# Patient Record
Sex: Male | Born: 1995
Health system: Southern US, Community
[De-identification: ages and names within clinical notes are randomized; demographics above are authoritative.]

## PROBLEM LIST (undated history)

## (undated) DIAGNOSIS — B019 Varicella without complication: Secondary | ICD-10-CM

## (undated) DIAGNOSIS — F909 Attention-deficit hyperactivity disorder, unspecified type: Secondary | ICD-10-CM

## (undated) DIAGNOSIS — B2790 Infectious mononucleosis, unspecified without complication: Secondary | ICD-10-CM

## (undated) DIAGNOSIS — J45909 Unspecified asthma, uncomplicated: Secondary | ICD-10-CM

## (undated) HISTORY — PX: TYMPANOSTOMY TUBE PLACEMENT: SHX32

## (undated) HISTORY — DX: Varicella without complication: B01.9

## (undated) HISTORY — PX: APPENDECTOMY: SHX54

## (undated) HISTORY — DX: Unspecified asthma, uncomplicated: J45.909

## (undated) HISTORY — DX: Attention-deficit hyperactivity disorder, unspecified type: F90.9

## (undated) HISTORY — DX: Infectious mononucleosis, unspecified without complication: B27.90

---
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
## 2016-11-20 ENCOUNTER — Ambulatory Visit (INDEPENDENT_AMBULATORY_CARE_PROVIDER_SITE_OTHER): Payer: 59 | Admitting: Family Medicine

## 2016-11-20 ENCOUNTER — Encounter: Payer: Self-pay | Admitting: Family Medicine

## 2016-11-20 VITALS — BP 132/88 | HR 84 | Temp 98.1°F | Ht 75.0 in | Wt 239.4 lb

## 2016-11-20 DIAGNOSIS — J45909 Unspecified asthma, uncomplicated: Secondary | ICD-10-CM | POA: Insufficient documentation

## 2016-11-20 DIAGNOSIS — W540XXA Bitten by dog, initial encounter: Secondary | ICD-10-CM | POA: Diagnosis not present

## 2016-11-20 DIAGNOSIS — Z23 Encounter for immunization: Secondary | ICD-10-CM

## 2016-11-20 DIAGNOSIS — J4530 Mild persistent asthma, uncomplicated: Secondary | ICD-10-CM | POA: Diagnosis not present

## 2016-11-20 DIAGNOSIS — F909 Attention-deficit hyperactivity disorder, unspecified type: Secondary | ICD-10-CM | POA: Diagnosis not present

## 2016-11-20 MED ORDER — ALBUTEROL SULFATE HFA 108 (90 BASE) MCG/ACT IN AERS: 2.0000 | INHALATION_SPRAY | Freq: Four times a day (QID) | RESPIRATORY_TRACT | 0 refills | Status: DC | PRN

## 2016-11-20 NOTE — Assessment & Plan Note (Signed)
On Adderall. We'll request records. Advised that I would not be able to prescribe medication until we see prior records.

## 2016-11-20 NOTE — Progress Notes (Signed)
Marikay Alar, MD Phone: [phone]  VENIAMIN KINCAID is a 21 y.o. male who presents today for new patient visit.  ADHD: Taking Adderall 25 mg daily. Followed by his prior PCP for this. No palpitations, decreased appetite, or weight changes.  Asthma: Uses his albuterol inhaler 2-3 times a week. Particularly has to use it after playing with his dog. Also occasionally has to use it after running for greater than 1.5 miles. No nighttime symptoms. No history of prednisone use.  About a week and a half ago the patient notes that he was playing with his dog and his dog bit him on his right hand. There was a small puncture wound. This has been healing well. There has been no redness, drainage, or fever. He's had no pain. The dog is up to date on vaccines. The dog has been behaving normally.   Active Ambulatory Problems    Diagnosis Date Noted  . ADHD 11/20/2016  . Asthma 11/20/2016  . Dog bite 11/20/2016   Resolved Ambulatory Problems    Diagnosis Date Noted  . No Resolved Ambulatory Problems   Past Medical History:  Diagnosis Date  . ADHD   . Asthma   . Chickenpox     Family History  Problem Relation Age of Onset  . Hyperlipidemia Father   . Heart disease Father   . Hypertension Father   . Hyperlipidemia Paternal Grandfather   . Heart disease Paternal Grandfather   . Hypertension Paternal Grandfather   . Arthritis Other     Social History   Social History  . Marital status: Single    Spouse name: N/A  . Number of children: N/A  . Years of education: N/A   Occupational History  . Not on file.   Social History Main Topics  . Smoking status: Never Smoker  . Smokeless tobacco: Never Used  . Alcohol use No  . Drug use: No  . Sexual activity: Not on file   Other Topics Concern  . Not on file   Social History Narrative  . No narrative on file    ROS  General:  Negative for nexplained weight loss, fever Skin: Negative for new or changing mole, sore that  won't heal HEENT: Negative for trouble hearing, trouble seeing, ringing in ears, mouth sores, hoarseness, change in voice, dysphagia. CV:  Negative for chest pain, dyspnea, edema, palpitations Resp: Negative for cough, dyspnea, hemoptysis GI: Negative for nausea, vomiting, diarrhea, constipation, abdominal pain, melena, hematochezia. GU: Negative for dysuria, incontinence, urinary hesitance, hematuria, vaginal or penile discharge, polyuria, sexual difficulty, lumps in testicle or breasts MSK: Negative for muscle cramps or aches, joint pain or swelling Neuro: Negative for headaches, weakness, numbness, dizziness, passing out/fainting Psych: Negative for depression, anxiety, memory problems  Objective  Physical Exam Vitals:   11/20/16 1329  BP: 132/88  Pulse: 84  Temp: 98.1 F (36.7 C)    BP Readings from Last 3 Encounters:  11/20/16 132/88   Wt Readings from Last 3 Encounters:  11/20/16 239 lb 6.4 oz (108.6 kg) (99 %, Z= 2.23)*   * Growth percentiles are based on CDC 2-20 Years data.    Physical Exam  Constitutional: No distress.  HENT:  Head: Normocephalic and atraumatic.  Mouth/Throat: Oropharynx is clear and moist. No oropharyngeal exudate.  Eyes: Conjunctivae are normal. Pupils are equal, round, and reactive to light.  Cardiovascular: Normal rate, regular rhythm and normal heart sounds.   Pulmonary/Chest: Effort normal and breath sounds normal.  Abdominal: Soft. Bowel sounds  are normal. He exhibits no distension. There is no tenderness. There is no rebound and no guarding.  Musculoskeletal: He exhibits no edema.  Neurological: He is alert. Gait normal.  Skin: Skin is warm and dry. He is not diaphoretic.  Small scab right dorsum of hand, no surrounding erythema, warmth, or tenderness  Psychiatric: Mood and affect normal.     Assessment/Plan:   ADHD On Adderall. We'll request records. Advised that I would not be able to prescribe medication until we see prior  records.  Asthma Recently controlled with albuterol use about 2-3 times a week. No nighttime symptoms. Discussed that if he has to start using his albuterol more frequently we could consider a controller medication. Discussed using his albuterol prior to exercising to help prevent issues while exercising. He'll monitor and if worsening let us know.  Dog bite Small scab noted. Injury was greater than a week ago. No signs of infection. Dog was completely vaccinated per patient report. Patient up-to-date on vaccinations as well. Discussed monitoring the area and if it appears infected to getting it looked at. Advised evaluation in the future with dog bites.   Orders Placed This Encounter  Procedures  . HPV 9-valent vaccine,Recombinat    Meds ordered this encounter  Medications  . amphetamine-dextroamphetamine (ADDERALL XR) 25 MG 24 hr capsule    Sig: Take 25 mg by mouth every morning.  Marland Kitchen. albuterol (PROVENTIL HFA;VENTOLIN HFA) 108 (90 Base) MCG/ACT inhaler    Sig: Inhale 2 puffs into the lungs every 6 (six) hours as needed for wheezing or shortness of breath.    Dispense:  1 Inhaler    Refill:  0    Marikay AlarEric Sonnenberg, MD [facility] [place]

## 2016-11-20 NOTE — Assessment & Plan Note (Signed)
Recently controlled with albuterol use about 2-3 times a week. No nighttime symptoms. Discussed that if he has to start using his albuterol more frequently we could consider a controller medication. Discussed using his albuterol prior to exercising to help prevent issues while exercising. He'll monitor and if worsening let us know.

## 2016-11-20 NOTE — Patient Instructions (Signed)
Nice to meet you. We'll request records from your prior PCP regarding your Adderall. Once we receive these we can take over prescribing. I have refilled your albuterol inhaler. He can try using it prior to running. If you start to require it more frequently please let us know. Please monitor the right hand. If you develop any swelling, warmth, erythema, or pain please let us know.

## 2016-11-20 NOTE — Assessment & Plan Note (Signed)
Small scab noted. Injury was greater than a week ago. No signs of infection. Dog was completely vaccinated per patient report. Patient up-to-date on vaccinations as well. Discussed monitoring the area and if it appears infected to getting it looked at. Advised evaluation in the future with dog bites.

## 2017-01-04 ENCOUNTER — Ambulatory Visit (INDEPENDENT_AMBULATORY_CARE_PROVIDER_SITE_OTHER): Payer: 59 | Admitting: Family Medicine

## 2017-01-04 ENCOUNTER — Encounter: Payer: Self-pay | Admitting: Family Medicine

## 2017-01-04 DIAGNOSIS — F909 Attention-deficit hyperactivity disorder, unspecified type: Secondary | ICD-10-CM

## 2017-01-04 MED ORDER — AMPHETAMINE-DEXTROAMPHET ER 25 MG PO CP24: 25.0000 mg | ORAL_CAPSULE | ORAL | 0 refills | Status: DC

## 2017-01-04 MED ORDER — AMPHETAMINE-DEXTROAMPHETAMINE 10 MG PO TABS: 10.0000 mg | ORAL_TABLET | Freq: Every day | ORAL | 0 refills | Status: DC | PRN

## 2017-01-04 MED ORDER — AMPHETAMINE-DEXTROAMPHET ER 25 MG PO CP24
25.0000 mg | ORAL_CAPSULE | ORAL | 0 refills | Status: DC
Start: 2017-01-04 — End: 2017-04-27

## 2017-01-04 NOTE — Patient Instructions (Signed)
Nice to see you. We'll provide you with Adderall refills. We'll see you back in 3 months.

## 2017-01-04 NOTE — Progress Notes (Signed)
  Lance Lance Cheron Coryell, MD Phone: [phone]  Lance Rodriguez is a 21 y.o. male who presents today for follow-up.  ADHD: Patient notes he has been on Adderall for the last several years. Notes it works well for him. He takes 25 mg of the extended release version in the morning daily. If he has to study he will take 10 mg of the immediate release version later in the day. He notes no sleep changes, appetite changes, weight changes, or palpitations. Prior records have been reviewed to confirm Adderall use.  ROS see history of present illness  Objective  Physical Exam Vitals:   01/04/17 0840  BP: 102/60  Pulse: 61  Temp: 97.9 F (36.6 C)    BP Readings from Last 3 Encounters:  01/04/17 102/60  11/20/16 132/88   Wt Readings from Last 3 Encounters:  01/04/17 241 lb 6.4 oz (109.5 kg) (99 %, Z= 2.26)*  11/20/16 239 lb 6.4 oz (108.6 kg) (99 %, Z= 2.23)*   * Growth percentiles are based on CDC 2-20 Years data.    Physical Exam  Constitutional: No distress.  Cardiovascular: Normal rate, regular rhythm and normal heart sounds.   Pulmonary/Chest: Effort normal and breath sounds normal.  Neurological: He is alert. Gait normal.  Skin: Skin is warm and dry. He is not diaphoretic.     Assessment/Plan: Please see individual problem list.  ADHD Doing well on Adderall. Records have been reviewed. Refills given. Follow-up in 3 months.   No orders of the defined types were placed in this encounter.   Meds ordered this encounter  Medications  . amphetamine-dextroamphetamine (ADDERALL XR) 25 MG 24 hr capsule    Sig: Take 1 capsule by mouth every morning.    Dispense:  30 capsule    Refill:  0  . amphetamine-dextroamphetamine (ADDERALL XR) 25 MG 24 hr capsule    Sig: Take 1 capsule by mouth every morning. Do not fill until 02/04/17    Dispense:  30 capsule    Refill:  0  . amphetamine-dextroamphetamine (ADDERALL XR) 25 MG 24 hr capsule    Sig: Take 1 capsule by mouth every morning.  Do not fill until 03/06/17    Dispense:  30 capsule    Refill:  0  . amphetamine-dextroamphetamine (ADDERALL) 10 MG tablet    Sig: Take 1 tablet (10 mg total) by mouth daily as needed (adhd).    Dispense:  30 tablet    Refill:  0  . amphetamine-dextroamphetamine (ADDERALL) 10 MG tablet    Sig: Take 1 tablet (10 mg total) by mouth daily as needed (adhd). Do not fill until 02/04/17    Dispense:  30 tablet    Refill:  0  . amphetamine-dextroamphetamine (ADDERALL) 10 MG tablet    Sig: Take 1 tablet (10 mg total) by mouth daily as needed (adhd). Do not fill until 03/06/17    Dispense:  30 tablet    Refill:  0    Lance Lance Lance Holladay, MD [facility] [place]

## 2017-01-04 NOTE — Assessment & Plan Note (Signed)
Doing well on Adderall. Records have been reviewed. Refills given. Follow-up in 3 months.

## 2017-04-10 ENCOUNTER — Telehealth: Payer: Self-pay | Admitting: Family Medicine

## 2017-04-10 NOTE — Telephone Encounter (Signed)
Please clarify with patient so I can advise

## 2017-04-10 NOTE — Telephone Encounter (Unsigned)
Copied from CRM [id number]. Topic: Inquiry >> Apr 10, 2017 11:24 AM Raquel SarnaHayes, Teresa G wrote: Aderal 25mg  - previously  Pt medication is not working  with and without food - working Mid day not working

## 2017-04-10 NOTE — Telephone Encounter (Signed)
Pt states Adderall

## 2017-04-27 ENCOUNTER — Other Ambulatory Visit: Payer: Self-pay

## 2017-04-27 ENCOUNTER — Encounter: Payer: Self-pay | Admitting: Family Medicine

## 2017-04-27 ENCOUNTER — Ambulatory Visit (INDEPENDENT_AMBULATORY_CARE_PROVIDER_SITE_OTHER): Payer: 59 | Admitting: Family Medicine

## 2017-04-27 DIAGNOSIS — Z23 Encounter for immunization: Secondary | ICD-10-CM

## 2017-04-27 DIAGNOSIS — F909 Attention-deficit hyperactivity disorder, unspecified type: Secondary | ICD-10-CM

## 2017-04-27 DIAGNOSIS — J4530 Mild persistent asthma, uncomplicated: Secondary | ICD-10-CM

## 2017-04-27 MED ORDER — AMPHETAMINE-DEXTROAMPHET ER 30 MG PO CP24: 30.0000 mg | ORAL_CAPSULE | ORAL | 0 refills | Status: DC

## 2017-04-27 MED ORDER — AMPHETAMINE-DEXTROAMPHETAMINE 12.5 MG PO TABS: 12.5000 mg | ORAL_TABLET | Freq: Every day | ORAL | 0 refills | Status: DC | PRN

## 2017-04-27 NOTE — Assessment & Plan Note (Signed)
Well-controlled. Continue to monitor. 

## 2017-04-27 NOTE — Progress Notes (Signed)
Lance AlarEric Symia Herdt, MD Phone: [phone]  Lance Rodriguez is a 21 y.o. male who presents today for follow-up.  ADHD: Patient notes more trouble focusing than prior.  He has been having trouble at school to some degree.  His English professor has noted this with him straying from topics during his papers.  He is interested in altering his regimen.  Previously noted this regimen worked well prior to college.  He is currently taking Adderall XR 25 mg in the morning and 10 mg later in the day to help study.  He notes no sleep changes.  No appetite suppression or weight loss.  No palpitations.  Asthma: Notes this is well controlled.  Rarely uses his albuterol after he runs.  No nighttime symptoms.  No wheezing.  No cough.  No shortness of breath.  Social History   Tobacco Use  Smoking Status Never Smoker  Smokeless Tobacco Never Used     ROS see history of present illness  Objective  Physical Exam Vitals:   04/27/17 1055  BP: 130/78  Pulse: 85  Temp: 98.2 F (36.8 C)  SpO2: 98%    BP Readings from Last 3 Encounters:  04/27/17 130/78  01/04/17 102/60 (<1 %, Z < -2.33 /  8 %, Z = -1.38)*  11/20/16 132/88 (72 %, Z = 0.59 /  94 %, Z = 1.54)*   *BP percentiles are based on the August 2017 AAP Clinical Practice Guideline for boys   Wt Readings from Last 3 Encounters:  04/27/17 235 lb 3.2 oz (106.7 kg)  01/04/17 241 lb 6.4 oz (109.5 kg) (99 %, Z= 2.26)*  11/20/16 239 lb 6.4 oz (108.6 kg) (99 %, Z= 2.23)*   * Growth percentiles are based on CDC (Boys, 2-20 Years) data.    Physical Exam  Constitutional: No distress.  Cardiovascular: Normal rate, regular rhythm and normal heart sounds.  Pulmonary/Chest: Effort normal and breath sounds normal.  Neurological: He is alert. Gait normal.  Skin: Skin is warm and dry. He is not diaphoretic.     Assessment/Plan: Please see individual problem list.  Asthma Well-controlled.  Continue to monitor.  ADHD Patient has had more  trouble since starting college.  Discussed options of trying an increased dose versus an alternative medication.  We opted to increase the dose given that he has responded well to his Adderall in the past.  Discussed monitoring for symptoms of side effects.  If not beneficial could consider an alternative medication.   Lance Rodriguez was seen today for follow-up.  Diagnoses and all orders for this visit:  Need for immunization against influenza -     Flu Vaccine QUAD 36+ mos IM  Mild persistent asthma without complication  Attention deficit hyperactivity disorder (ADHD), unspecified ADHD type  Other orders -     amphetamine-dextroamphetamine (ADDERALL XR) 30 MG 24 hr capsule; Take 1 capsule (30 mg total) by mouth every morning. -     amphetamine-dextroamphetamine (ADDERALL XR) 30 MG 24 hr capsule; Take 1 capsule (30 mg total) by mouth every morning. Do not fill until 06/27/17 -     amphetamine-dextroamphetamine (ADDERALL XR) 30 MG 24 hr capsule; Take 1 capsule (30 mg total) by mouth every morning. Do not fill until 05/27/17 -     amphetamine-dextroamphetamine (ADDERALL) 12.5 MG tablet; Take 1 tablet by mouth daily as needed (adhd). -     amphetamine-dextroamphetamine (ADDERALL) 12.5 MG tablet; Take 1 tablet by mouth daily as needed (adhd). Do not fill until 06/27/17 -  amphetamine-dextroamphetamine (ADDERALL) 12.5 MG tablet; Take 1 tablet by mouth daily as needed (adhd). Do not fill until 05/27/17    Orders Placed This Encounter  Procedures  . Flu Vaccine QUAD 36+ mos IM    Meds ordered this encounter  Medications  . amphetamine-dextroamphetamine (ADDERALL XR) 30 MG 24 hr capsule    Sig: Take 1 capsule (30 mg total) by mouth every morning.    Dispense:  30 capsule    Refill:  0  . amphetamine-dextroamphetamine (ADDERALL XR) 30 MG 24 hr capsule    Sig: Take 1 capsule (30 mg total) by mouth every morning. Do not fill until 06/27/17    Dispense:  30 capsule    Refill:  0  .  amphetamine-dextroamphetamine (ADDERALL XR) 30 MG 24 hr capsule    Sig: Take 1 capsule (30 mg total) by mouth every morning. Do not fill until 05/27/17    Dispense:  30 capsule    Refill:  0  . amphetamine-dextroamphetamine (ADDERALL) 12.5 MG tablet    Sig: Take 1 tablet by mouth daily as needed (adhd).    Dispense:  30 tablet    Refill:  0  . amphetamine-dextroamphetamine (ADDERALL) 12.5 MG tablet    Sig: Take 1 tablet by mouth daily as needed (adhd). Do not fill until 06/27/17    Dispense:  30 tablet    Refill:  0  . amphetamine-dextroamphetamine (ADDERALL) 12.5 MG tablet    Sig: Take 1 tablet by mouth daily as needed (adhd). Do not fill until 05/27/17    Dispense:  30 tablet    Refill:  0     Lance AlarEric Xabi Wittler, MD [facility] [place]

## 2017-04-27 NOTE — Patient Instructions (Signed)
Nice to see you. Please try the higher dose of the Adderall to see if that will help with your focusing. If you have not noticed an improvement following finals please let us know and we may need to try a different medication.  If you notice sleep issues, appetite suppression, weight loss, or palpitations with this change please let us know.

## 2017-04-27 NOTE — Assessment & Plan Note (Signed)
Patient has had more trouble since starting college.  Discussed options of trying an increased dose versus an alternative medication.  We opted to increase the dose given that he has responded well to his Adderall in the past.  Discussed monitoring for symptoms of side effects.  If not beneficial could consider an alternative medication.

## 2017-07-02 DIAGNOSIS — J029 Acute pharyngitis, unspecified: Secondary | ICD-10-CM | POA: Diagnosis not present

## 2017-07-02 DIAGNOSIS — J00 Acute nasopharyngitis [common cold]: Secondary | ICD-10-CM | POA: Diagnosis not present

## 2017-07-30 ENCOUNTER — Ambulatory Visit: Payer: 59 | Admitting: Family Medicine

## 2017-08-26 DIAGNOSIS — S61253A Open bite of left middle finger without damage to nail, initial encounter: Secondary | ICD-10-CM | POA: Diagnosis not present

## 2017-08-26 DIAGNOSIS — S61239A Puncture wound without foreign body of unspecified finger without damage to nail, initial encounter: Secondary | ICD-10-CM | POA: Diagnosis not present

## 2017-08-26 DIAGNOSIS — W5321XA Bitten by squirrel, initial encounter: Secondary | ICD-10-CM | POA: Diagnosis not present

## 2017-08-26 DIAGNOSIS — S61259A Open bite of unspecified finger without damage to nail, initial encounter: Secondary | ICD-10-CM | POA: Diagnosis not present

## 2018-12-05 ENCOUNTER — Ambulatory Visit (INDEPENDENT_AMBULATORY_CARE_PROVIDER_SITE_OTHER): Payer: 59 | Admitting: Internal Medicine

## 2018-12-05 ENCOUNTER — Other Ambulatory Visit: Payer: Self-pay

## 2018-12-05 ENCOUNTER — Telehealth: Payer: Self-pay | Admitting: *Deleted

## 2018-12-05 DIAGNOSIS — Z20822 Contact with and (suspected) exposure to covid-19: Secondary | ICD-10-CM

## 2018-12-05 DIAGNOSIS — J029 Acute pharyngitis, unspecified: Secondary | ICD-10-CM

## 2018-12-05 DIAGNOSIS — Z20828 Contact with and (suspected) exposure to other viral communicable diseases: Secondary | ICD-10-CM | POA: Diagnosis not present

## 2018-12-05 MED ORDER — AMOXICILLIN-POT CLAVULANATE 875-125 MG PO TABS: 1.0000 | ORAL_TABLET | Freq: Two times a day (BID) | ORAL | 0 refills | Status: DC

## 2018-12-05 NOTE — Progress Notes (Signed)
Virtual Visit via Video Note  I connected with Lance Rodriguez   on 12/05/18 at  8:00 AM EDT by a video enabled telemedicine application and verified that I am speaking with the correct person using two identifiers.  Location patient: home Location provider:work  Persons participating in the virtual visit: patient, provider  I discussed the limitations of evaluation and management by telemedicine and the availability of in person appointments. The patient expressed understanding and agreed to proceed.   HPI: Sore throat since 12/01/2018 returning from [facility] with family x 3 days. No sick contacts I.e family members w/o sx's no fever, no body aches, fatigue tried Advil and Nyquil w/o help also tried warm salt gargles and helps x 2-3 hours then throat is sore again. Throat is red and swollen and has swollen glands in his neck and trouble swallowing. No white changes in throat. No loss of taste/smell/sob   ROS: See pertinent positives and negatives per HPI.  Past Medical History:  Diagnosis Date  . ADHD   . Asthma   . Chickenpox     Past Surgical History:  Procedure Laterality Date  . APPENDECTOMY      Family History  Problem Relation Age of Onset  . Hyperlipidemia Father   . Heart disease Father   . Hypertension Father   . Hyperlipidemia Paternal Grandfather   . Heart disease Paternal Grandfather   . Hypertension Paternal Grandfather   . Arthritis Other     SOCIAL HX: lives with parents    Current Outpatient Medications:  .  albuterol (PROVENTIL HFA;VENTOLIN HFA) 108 (90 Base) MCG/ACT inhaler, Inhale 2 puffs into the lungs every 6 (six) hours as needed for wheezing or shortness of breath. (Patient not taking: Reported on 12/05/2018), Disp: 1 Inhaler, Rfl: 0 .  amoxicillin-clavulanate (AUGMENTIN) 875-125 MG tablet, Take 1 tablet by mouth 2 (two) times daily., Disp: 14 tablet, Rfl: 0 .  amphetamine-dextroamphetamine (ADDERALL XR) 30 MG 24 hr capsule, Take 1 capsule (30 mg  total) by mouth every morning. (Patient not taking: Reported on 12/05/2018), Disp: 30 capsule, Rfl: 0 .  amphetamine-dextroamphetamine (ADDERALL XR) 30 MG 24 hr capsule, Take 1 capsule (30 mg total) by mouth every morning. Do not fill until 06/27/17 (Patient not taking: Reported on 12/05/2018), Disp: 30 capsule, Rfl: 0 .  amphetamine-dextroamphetamine (ADDERALL XR) 30 MG 24 hr capsule, Take 1 capsule (30 mg total) by mouth every morning. Do not fill until 05/27/17 (Patient not taking: Reported on 12/05/2018), Disp: 30 capsule, Rfl: 0 .  amphetamine-dextroamphetamine (ADDERALL) 12.5 MG tablet, Take 1 tablet by mouth daily as needed (adhd). (Patient not taking: Reported on 12/05/2018), Disp: 30 tablet, Rfl: 0 .  amphetamine-dextroamphetamine (ADDERALL) 12.5 MG tablet, Take 1 tablet by mouth daily as needed (adhd). Do not fill until 06/27/17 (Patient not taking: Reported on 12/05/2018), Disp: 30 tablet, Rfl: 0 .  amphetamine-dextroamphetamine (ADDERALL) 12.5 MG tablet, Take 1 tablet by mouth daily as needed (adhd). Do not fill until 05/27/17 (Patient not taking: Reported on 12/05/2018), Disp: 30 tablet, Rfl: 0  EXAM:  VITALS per patient if applicable:  GENERAL: alert, oriented, appears well and in no acute distress  HEENT: atraumatic, conjunttiva clear, no obvious abnormalities on inspection of external nose and ears  NECK: normal movements of the head and neck  LUNGS: on inspection no signs of respiratory distress, breathing rate appears normal, no obvious gross SOB, gasping or wheezing  CV: no obvious cyanosis  MS: moves all visible extremities without noticeable abnormality  PSYCH/NEURO:  pleasant and cooperative, no obvious depression or anxiety, speech and thought processing grossly intact  ASSESSMENT AND PLAN:  Discussed the following assessment and plan:  Sore throat ? covid vs bacterial vs viral - Plan: amoxicillin-clavulanate (AUGMENTIN) 875-125 MG tablet bid x 1 week   Exposure to Covid-19  Virus - Plan: out of work until results back  Supportive care otc tylenol warm salt gargles  Note for work to be out      I discussed the assessment and treatment plan with the patient. The patient was provided an opportunity to ask questions and all were answered. The patient agreed with the plan and demonstrated an understanding of the instructions.   The patient was advised to call back or seek an in-person evaluation if the symptoms worsen or if the condition fails to improve as anticipated.  Time spent 15 minutes  Bevelyn Bucklesracy N McLean-Scocuzza, MD

## 2018-12-05 NOTE — Telephone Encounter (Signed)
Pt referred by Dr. Terese Door for covid-19 testing.  Pt scheduled for tomorrow at 8:45 at the [facility]. Advised that this is a drive thru test site and he would need to stay in the car with mask on and windows rolled up until time for testing.  He voiced understanding.

## 2018-12-06 ENCOUNTER — Other Ambulatory Visit: Payer: 59

## 2018-12-06 DIAGNOSIS — Z20822 Contact with and (suspected) exposure to covid-19: Secondary | ICD-10-CM

## 2018-12-07 ENCOUNTER — Encounter (INDEPENDENT_AMBULATORY_CARE_PROVIDER_SITE_OTHER): Payer: Self-pay

## 2018-12-08 ENCOUNTER — Encounter (INDEPENDENT_AMBULATORY_CARE_PROVIDER_SITE_OTHER): Payer: Self-pay

## 2018-12-09 ENCOUNTER — Encounter (INDEPENDENT_AMBULATORY_CARE_PROVIDER_SITE_OTHER): Payer: Self-pay

## 2018-12-11 LAB — NOVEL CORONAVIRUS, NAA: SARS-CoV-2, NAA: NOT DETECTED

## 2018-12-17 ENCOUNTER — Encounter (INDEPENDENT_AMBULATORY_CARE_PROVIDER_SITE_OTHER): Payer: Self-pay

## 2019-03-07 ENCOUNTER — Ambulatory Visit: Payer: 59 | Admitting: Family Medicine

## 2021-03-15 ENCOUNTER — Other Ambulatory Visit: Payer: Self-pay

## 2021-03-15 ENCOUNTER — Ambulatory Visit (INDEPENDENT_AMBULATORY_CARE_PROVIDER_SITE_OTHER): Payer: 59 | Admitting: Family Medicine

## 2021-03-15 VITALS — BP 118/80 | HR 74 | Temp 97.9°F | Ht 75.0 in | Wt 242.1 lb

## 2021-03-15 DIAGNOSIS — E669 Obesity, unspecified: Secondary | ICD-10-CM | POA: Diagnosis not present

## 2021-03-15 DIAGNOSIS — Z13220 Encounter for screening for lipoid disorders: Secondary | ICD-10-CM

## 2021-03-15 DIAGNOSIS — Z0001 Encounter for general adult medical examination with abnormal findings: Secondary | ICD-10-CM | POA: Insufficient documentation

## 2021-03-15 DIAGNOSIS — J309 Allergic rhinitis, unspecified: Secondary | ICD-10-CM | POA: Insufficient documentation

## 2021-03-15 DIAGNOSIS — J302 Other seasonal allergic rhinitis: Secondary | ICD-10-CM | POA: Insufficient documentation

## 2021-03-15 DIAGNOSIS — M25562 Pain in left knee: Secondary | ICD-10-CM | POA: Diagnosis not present

## 2021-03-15 LAB — LIPID PANEL
Cholesterol: 127 mg/dL (ref 0–200)
HDL: 46.3 mg/dL (ref >39.00)
LDL Cholesterol: 67 mg/dL (ref 0–99)
NonHDL: 80.57
Total CHOL/HDL Ratio: 3
Triglycerides: 67 mg/dL (ref 0.0–149.0)
VLDL: 13.4 mg/dL (ref 0.0–40.0)

## 2021-03-15 LAB — HEMOGLOBIN A1C: Hgb A1c MFr Bld: 4.7 % (ref 4.6–6.5)

## 2021-03-15 LAB — COMPREHENSIVE METABOLIC PANEL
ALT: 33 U/L (ref 0–53)
AST: 22 U/L (ref 0–37)
Albumin: 4.7 g/dL (ref 3.5–5.2)
Alkaline Phosphatase: 50 U/L (ref 39–117)
BUN: 15 mg/dL (ref 6–23)
CO2: 28 mEq/L (ref 19–32)
Calcium: 9.7 mg/dL (ref 8.4–10.5)
Chloride: 102 mEq/L (ref 96–112)
Creatinine, Ser: 1.18 mg/dL (ref 0.40–1.50)
GFR: 86.73 mL/min (ref >60.00)
Glucose, Bld: 89 mg/dL (ref 70–99)
Potassium: 4 mEq/L (ref 3.5–5.1)
Sodium: 138 mEq/L (ref 135–145)
Total Bilirubin: 0.9 mg/dL (ref 0.2–1.2)
Total Protein: 7.2 g/dL (ref 6.0–8.3)

## 2021-03-15 MED ORDER — FLUTICASONE PROPIONATE 50 MCG/ACT NA SUSP: 2.0000 | Freq: Every day | NASAL | 6 refills | Status: DC

## 2021-03-15 MED ORDER — NAPROXEN 500 MG PO TABS: 500.0000 mg | ORAL_TABLET | Freq: Two times a day (BID) | ORAL | 0 refills | Status: AC

## 2021-03-15 NOTE — Assessment & Plan Note (Signed)
The patient has chronic issues with allergies.  I suspect some of his congestion is rebound congestion from using Afrin on a constant basis.  We will have him discontinue the Afrin.  He will start on Flonase.  If this is not beneficial he will let me know.

## 2021-03-15 NOTE — Assessment & Plan Note (Signed)
Patient's high BMI is related to his muscular nature.  He is in very good shape and can continue with his diet and exercise activities.

## 2021-03-15 NOTE — Progress Notes (Signed)
Tommi Rumps, MD Phone: [phone]  Lance Rodriguez is a 25 y.o. male who presents today for CPE.  Diet: healthy, lots of lean meats and vegetables Exercise: lifts 6x/week, cardio 3x/week, he uses protein powder and creatine.  He does drink 3-4 64 ounce bottles of water per day. Family history-  Prostate cancer: no  Colon cancer: no Sexually active: not currently Vaccines-   Flu: declines  Tetanus: reports having around 2018  COVID19: declines HIV screening: donated blood previously Hep C Screening: donated blood previously Tobacco use: no Alcohol use: 4-5/GYBW Illicit Drug use: no Dentist: yes Ophthalmology: yes  Urgent rhinitis: This is a chronic issue.  He notes he is never really been able to breathe out of his nose well.  He has been using Afrin nasal spray for at least the last year.  He does note some sneezing and rhinorrhea as well as postnasal drip.  No fevers.  Left knee pain: This is in his anterior knee over his patellar tendon.  It started back in February when he started reintroduce squats.  He lifts very heavy weights.  His heavy squats are around 400 pounds.  His wider squats are around 315.  Notes it has improved over the last 2 weeks as he has decreased the weight of his squats.  Active Ambulatory Problems    Diagnosis Date Noted   ADHD 11/20/2016   Asthma 11/20/2016   Dog bite 11/20/2016   Encounter for general adult medical examination with abnormal findings 03/15/2021   Allergic rhinitis 03/15/2021   Left knee pain 03/15/2021   Obesity (BMI 30.0-34.9) 03/15/2021   Resolved Ambulatory Problems    Diagnosis Date Noted   No Resolved Ambulatory Problems   Past Medical History:  Diagnosis Date   Chickenpox     Family History  Problem Relation Age of Onset   Hyperlipidemia Father    Heart disease Father    Hypertension Father    Hyperlipidemia Paternal Grandfather    Heart disease Paternal Grandfather    Hypertension Paternal Grandfather     Arthritis Other     Social History   Socioeconomic History   Marital status: Single    Spouse name: Not on file   Number of children: Not on file   Years of education: Not on file   Highest education level: Not on file  Occupational History   Not on file  Tobacco Use   Smoking status: Never   Smokeless tobacco: Never  Substance and Sexual Activity   Alcohol use: No   Drug use: No   Sexual activity: Not on file  Other Topics Concern   Not on file  Social History Narrative   Not on file   Social Determinants of Health   Financial Resource Strain: Not on file  Food Insecurity: Not on file  Transportation Needs: Not on file  Physical Activity: Not on file  Stress: Not on file  Social Connections: Not on file  Intimate Partner Violence: Not on file    ROS  General:  Negative for nexplained weight loss, fever Skin: Negative for new or changing mole, sore that won't heal HEENT: Negative for trouble hearing, trouble seeing, ringing in ears, mouth sores, hoarseness, change in voice, dysphagia. CV:  Negative for chest pain, dyspnea, edema, palpitations Resp: Negative for cough, dyspnea, hemoptysis GI: Negative for nausea, vomiting, diarrhea, constipation, abdominal pain, melena, hematochezia. GU: Negative for dysuria, incontinence, urinary hesitance, hematuria, vaginal or penile discharge, polyuria, sexual difficulty, lumps in testicle or breasts  MSK: Negative for muscle cramps or aches, joint pain or swelling Neuro: Negative for headaches, weakness, numbness, dizziness, passing out/fainting Psych: Negative for depression, anxiety, memory problems  Objective  Physical Exam Vitals:   03/15/21 0816  BP: 118/80  Pulse: 74  Temp: 97.9 F (36.6 C)  SpO2: 99%    BP Readings from Last 3 Encounters:  03/15/21 118/80  04/27/17 130/78  01/04/17 102/60   Wt Readings from Last 3 Encounters:  03/15/21 242 lb 1.9 oz (109.8 kg)  04/27/17 235 lb 3.2 oz (106.7 kg)   01/04/17 241 lb 6.4 oz (109.5 kg) (99 %, Z= 2.26)*   * Growth percentiles are based on CDC (Boys, 2-20 Years) data.    Physical Exam Constitutional:      General: He is not in acute distress.    Appearance: He is not diaphoretic.  HENT:     Head: Normocephalic and atraumatic.  Eyes:     Conjunctiva/sclera: Conjunctivae normal.     Pupils: Pupils are equal, round, and reactive to light.  Cardiovascular:     Rate and Rhythm: Normal rate and regular rhythm.     Heart sounds: Normal heart sounds.  Pulmonary:     Effort: Pulmonary effort is normal.     Breath sounds: Normal breath sounds.  Abdominal:     General: Bowel sounds are normal. There is no distension.     Palpations: Abdomen is soft.     Tenderness: There is no abdominal tenderness. There is no guarding or rebound.  Musculoskeletal:     Right lower leg: No edema.     Left lower leg: No edema.     Comments: Left knee with slight tenderness over the patellar tendon, no bony tenderness, no other soft tissue tenderness, negative McMurray's  Lymphadenopathy:     Cervical: No cervical adenopathy.  Skin:    General: Skin is warm and dry.  Neurological:     Mental Status: He is alert.  Psychiatric:        Mood and Affect: Mood normal.     Assessment/Plan:   Problem List Items Addressed This Visit     Allergic rhinitis    The patient has chronic issues with allergies.  I suspect some of his congestion is rebound congestion from using Afrin on a constant basis.  We will have him discontinue the Afrin.  He will start on Flonase.  If this is not beneficial he will let me know.      Relevant Medications   fluticasone (FLONASE) 50 MCG/ACT nasal spray   Encounter for general adult medical examination with abnormal findings - Primary    Physical exam completed.  I encouraged continued healthy diet and exercise.  He declines flu and COVID vaccines.  I discussed that these were available if he changed his mind.  He reports  having had a tetanus vaccine in the last 5 to 6 years.  We were unable to pull up vaccine records today.  I will have the CMA try to pull these up at a later date.  Lab work as outlined.      Left knee pain    Suspect patellar tendon strain.  We will treat with a short course of Naprosyn.  He will take this with food.  Discussed that this could irritate his stomach and if it does he needs to let us know.  Discussed icing.  Advised to take a break from doing squats as those are the only things that cause the symptoms.  Advised he needs to take at least 1 to 2 weeks off and if he is still having symptoms after that timeframe he needs to let me know so we can refer to sports medicine.      Relevant Medications   naproxen (NAPROSYN) 500 MG tablet   Obesity (BMI 30.0-34.9)    Patient's high BMI is related to his muscular nature.  He is in very good shape and can continue with his diet and exercise activities.      Relevant Orders   HgB A1c   Other Visit Diagnoses     Lipid screening       Relevant Orders   Comp Met (CMET)   Lipid panel       Return in about 1 year (around 03/15/2022) for CPE.  This visit occurred during the SARS-CoV-2 public health emergency.  Safety protocols were in place, including screening questions prior to the visit, additional usage of staff PPE, and extensive cleaning of exam room while observing appropriate contact time as indicated for disinfecting solutions.    Tommi Rumps, MD [place]

## 2021-03-15 NOTE — Assessment & Plan Note (Signed)
Suspect patellar tendon strain.  We will treat with a short course of Naprosyn.  He will take this with food.  Discussed that this could irritate his stomach and if it does he needs to let us know.  Discussed icing.  Advised to take a break from doing squats as those are the only things that cause the symptoms.  Advised he needs to take at least 1 to 2 weeks off and if he is still having symptoms after that timeframe he needs to let me know so we can refer to sports medicine.

## 2021-03-15 NOTE — Assessment & Plan Note (Signed)
Physical exam completed.  I encouraged continued healthy diet and exercise.  He declines flu and COVID vaccines.  I discussed that these were available if he changed his mind.  He reports having had a tetanus vaccine in the last 5 to 6 years.  We were unable to pull up vaccine records today.  I will have the CMA try to pull these up at a later date.  Lab work as outlined.

## 2021-03-15 NOTE — Patient Instructions (Signed)
Nice to see you. Please continue with healthy diet and exercise. We will try to track down your vaccine records when the system is back up. Please ice your knee twice daily for about 10 minutes.  You need to take a break for at least 1 to 2 weeks from squats and if your symptoms are still persisting after that you need to let me know.  You can take the Naprosyn as prescribed for 5 days.  You should take this with food.  If it irritates her stomach please let us know. Please stop the Afrin.  You will start on the Flonase to see if that helps with your allergy symptoms.

## 2021-07-11 ENCOUNTER — Encounter: Payer: Self-pay | Admitting: Family Medicine

## 2021-07-11 DIAGNOSIS — J4530 Mild persistent asthma, uncomplicated: Secondary | ICD-10-CM

## 2021-07-11 DIAGNOSIS — J309 Allergic rhinitis, unspecified: Secondary | ICD-10-CM

## 2021-07-26 NOTE — Progress Notes (Signed)
NEW PATIENT Date of Service/Encounter:  07/27/21 Referring provider: Leone Haven, MD Primary care provider: Leone Haven, MD  Subjective:  Lance Rodriguez is a 26 y.o. male with a PMHx of ADHD, obesity, asthma status post tympanostomy tube placement presenting today for evaluation of chronic rhinitis and asthma. History obtained from: chart review and patient.   Chronic rhinitis: started when he was a young boy Symptoms include:  hyposmia, nasal congestion, rhinorrhea, post nasal drainage, sneezing, watery eyes, itchy eyes, and itchy nose  Occurs year-round Potential triggers: cats, outdoor pollen Treatments tried: Afrin nasal spray x 1 year, off since October 2022,  he then started fluticasone which made his nose bleed Previous allergy testing: yes-many years ago History of reflux/heartburn:  possibly felt some heartburn intermittent during a big diet change, he did clean up his diet and this resolved his symptoms  Asthma is intermittent, treats with albuterol PRN.   Using only if around dog in a small room, not even once per week.  Many years ago, he was tested and was positive to rice and peanuts.   He thinks these have resolved because he is eating these foods on a regular basis.  However, he would like retesting for reassurance.  Other allergy screening: Medication allergy: no Hymenoptera allergy: no Urticaria: no Eczema:no  Past Medical History: Past Medical History:  Diagnosis Date   ADHD    Asthma    Chickenpox    Medication List:  Current Outpatient Medications  Medication Sig Dispense Refill   albuterol (PROVENTIL HFA;VENTOLIN HFA) 108 (90 Base) MCG/ACT inhaler Inhale 2 puffs into the lungs every 6 (six) hours as needed for wheezing or shortness of breath. 1 Inhaler 0   phenylephrine (SUDAFED PE) 10 MG TABS tablet Take 10 mg by mouth 3 (three) times daily as needed.     fluticasone (FLONASE) 50 MCG/ACT nasal spray Place 2 sprays into both  nostrils daily. (Patient not taking: Reported on 07/27/2021) 16 g 6   No current facility-administered medications for this visit.   Known Allergies:  No Known Allergies Past Surgical History: Past Surgical History:  Procedure Laterality Date   APPENDECTOMY     TYMPANOSTOMY TUBE PLACEMENT     Family History: Family History  Problem Relation Age of Onset   Allergic rhinitis Mother    Allergic rhinitis Father    Hyperlipidemia Father    Heart disease Father    Hypertension Father    Eczema Brother    Hyperlipidemia Paternal Grandfather    Heart disease Paternal Grandfather    Hypertension Paternal Grandfather    Arthritis Other    Social History: Bessie lives in a house built 28 years ago, no water damage, carpet floors, gas heating, central AC, inside dog, no cockroaches, not using dust mite protection, no smoke exposure, 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   ROS:  All other systems negative except as noted per HPI.  Objective:  Blood pressure 116/66, pulse (!) 102, temperature 99.1 F (37.3 C), temperature source Temporal, resp. rate 12, height 6' 1.82" (1.875 m), weight 252 lb 12.8 oz (114.7 kg), SpO2 99 %. Body mass index is 32.62 kg/m. Physical Exam:  General Appearance:  Alert, cooperative, no distress, appears stated age  Head:  Normocephalic, without obvious abnormality, atraumatic  Eyes:  Conjunctiva clear, EOM's intact  Nose: Nares normal, hypertrophic turbinates, normal mucosa, and no visible anterior polyps  Throat: Lips, tongue normal; teeth and gums normal, normal  posterior oropharynx  Neck: Supple, symmetrical  Lungs:   clear to auscultation bilaterally, Respirations unlabored, no coughing  Heart:  regular rate and rhythm and no murmur, Appears well perfused  Extremities: No edema  Skin: Skin color, texture, turgor normal, no rashes or lesions on visualized portions of skin  Neurologic: No gross deficits      Diagnostics: Spirometry:  Tracings reviewed. His effort: Good reproducible efforts. FVC: 5.56L  FEV1: 4.94L, 97% predicted  FEV1/FVC ratio: 106%  Interpretation: Spirometry consistent with normal pattern   Skin Testing: Environmental allergy panel and select foods.  Adequate controls. Results discussed with patient/family.  Airborne Adult Perc - 07/27/21 1405     Time Antigen Placed 1410    Allergen Manufacturer Waynette ButteryGreer    Location Back    Number of Test 59    1. Control-Buffer 50% Glycerol Negative    2. Control-Histamine 1 mg/ml 3+    3. Albumin saline Negative    4. Bahia Negative    5. [place] 3+    6. Johnson 3+    7. Kentucky Blue 4+    8. Meadow Fescue Negative    9. Perennial Rye 4+    10. Sweet Vernal 3+    11. Timothy 3+    12. Cocklebur Negative    13. Burweed Marshelder Negative    14. Ragweed, short 4+    15. Ragweed, Giant 3+    16. Plantain,  English 3+    17. Lamb's Quarters Negative    18. Sheep Sorrell 3+    19. Rough Pigweed 2+    20. Marsh Elder, Rough 2+    21. Mugwort, Common Negative    22. Ash mix 3+    23. Birch mix Negative    24. Beech American 3+    25. Box, Elder 4+    26. Cedar, red 3+    27. Cottonwood, Eastern 3+    28. Elm mix Negative    29. Hickory 4+    30. Maple mix 3+    31. Oak, Guinea[place]Biss[place] mix 4+    32. Pecan Pollen 4+    33. Pine mix Negative    34. Sycamore Eastern Negative    35. Walnut, Black Pollen 3+    36. Alternaria alternata 3+    37. Cladosporium Herbarum Negative    38. Aspergillus mix Negative    39. Penicillium mix Negative    40. Bipolaris sorokiniana (Helminthosporium) Negative    41. Drechslera spicifera (Curvularia) 3+    42. Mucor plumbeus Negative    43. Fusarium moniliforme Negative    44. Aureobasidium pullulans (pullulara) Negative    45. Rhizopus oryzae Negative    46. Botrytis cinera Negative    47. Epicoccum nigrum 3+    48. Phoma betae 3+    49. Candida Albicans Negative    50.  Trichophyton mentagrophytes Negative    51. Mite, D Farinae  5,000 AU/ml 4+    52. Mite, D Pteronyssinus  5,000 AU/ml 4+    53. Cat Hair 10,000 BAU/ml Negative    54.  Dog Epithelia Negative    55. Mixed Feathers 3+    56. Horse Epithelia Negative    57. Cockroach, German Negative    58. Mouse Negative    59. Tobacco Leaf Negative             Intradermal - 07/27/21 1543     Time Antigen Placed 1518    Allergen Manufacturer Waynette ButteryGreer    Location  Arm    Number of Test 3    Control Negative    Cat 4+    Dog 4+             Food Adult Perc - 07/27/21 1400     Time Antigen Placed 1411    Allergen Manufacturer Lavella Hammock    Location Back    Number of allergen test 11    1. Peanut Negative    2. Soybean Negative    3. Wheat Negative    4. Sesame Negative    5. Milk, cow Negative    6. Egg White, Chicken Negative    7. Casein Negative    8. Shellfish Mix 2+    9. Fish Mix Negative    10. Cashew Negative    34. Rice Negative             Allergy testing results were read and interpreted by myself, documented by clinical staff.  Assessment and Plan  Chronic Rhinitis -seasonal and perennial allergic rhinitis with history of rhinitis medicamentosa: Uncontrolled - allergy testing today was positive to grasses, weeds, ragweed, trees, molds, dust mites, cats, dog, mixed feathers - allergen avoidance as below - consider allergy shots as long term control of your symptoms by teaching your immune system to be more tolerant of your allergy triggers - Start Singulair (Montelukast) 10mg  nightly. - Start over the counter antihistamine daily or daily as needed.   -Your options include Zyrtec (Cetirizine) 10mg , Claritin (Loratadine) 10mg , Allegra (Fexofenadine) 180mg , or Xyzal (Levocetirinze) 5mg  - Stop Sudafed (this also has a decongestant) - provided a baby Dosepak of prednisone to be used if needed to help come off Sudafed - Consider nasal saline rinses as needed to help moisturize  your nasal passages and rinse out pollens - sending in an Epipen (you will need to bring this to your allergy injection appointments)   Allergic Conjunctivitis: uncontrolled - Start Allergy Eye drops: great options include Pataday (Olopatadine) or Zaditor (ketotifen) for eye symptoms daily as needed-both sold over the counter if not covered by insurance.   -Avoid eye drops that say red eye relief as they may contain medications that dry out your eyes.  Intermittent Asthma: Controlled - your lung testing today looked great! - Rescue Inhaler: Albuterol (Proair/Ventolin) 2 puffs . Use  every 4-6 hours as needed for chest tightness, wheezing, or coughing.  Can also use 15 minutes prior to exercise if you have symptoms with activity. - Asthma is not controlled if:  - Symptoms are occurring >2 times a week OR  - >2 times a month nighttime awakenings  - You are requiring systemic steroids (prednisone/steroid injections) more than once per year  - Your require hospitalization for your asthma.  - Please call the clinic to schedule a follow up if these symptoms arise  Follow-up in 4 weeks, sooner if needed.   This note in its entirety was forwarded to the Provider who requested this consultation.  Thank you for your kind referral. I appreciate the opportunity to take part in Fard's care. Please do not hesitate to contact me with questions.  Sincerely,  Sigurd Sos, MD [facility]

## 2021-07-27 ENCOUNTER — Other Ambulatory Visit: Payer: Self-pay

## 2021-07-27 ENCOUNTER — Ambulatory Visit (INDEPENDENT_AMBULATORY_CARE_PROVIDER_SITE_OTHER): Payer: BC Managed Care – PPO | Admitting: Internal Medicine

## 2021-07-27 ENCOUNTER — Encounter: Payer: Self-pay | Admitting: Internal Medicine

## 2021-07-27 VITALS — BP 116/66 | HR 102 | Temp 99.1°F | Resp 12 | Ht 73.82 in | Wt 252.8 lb

## 2021-07-27 DIAGNOSIS — J310 Chronic rhinitis: Secondary | ICD-10-CM

## 2021-07-27 DIAGNOSIS — T781XXA Other adverse food reactions, not elsewhere classified, initial encounter: Secondary | ICD-10-CM | POA: Diagnosis not present

## 2021-07-27 DIAGNOSIS — H1013 Acute atopic conjunctivitis, bilateral: Secondary | ICD-10-CM

## 2021-07-27 DIAGNOSIS — J4530 Mild persistent asthma, uncomplicated: Secondary | ICD-10-CM | POA: Diagnosis not present

## 2021-07-27 DIAGNOSIS — J302 Other seasonal allergic rhinitis: Secondary | ICD-10-CM | POA: Insufficient documentation

## 2021-07-27 NOTE — Patient Instructions (Addendum)
Chronic Rhinitis -seasonal and perennial allergic rhinitis: ?- allergy testing today was positive to grasses, weeds, ragweed, trees, molds, dust mites, cats, dog, mixed feathers ?- allergen avoidance as below ?- consider allergy shots as long term control of your symptoms by teaching your immune system to be more tolerant of your allergy triggers ?- Start Singulair (Montelukast) 10mg  nightly. ?- Start over the counter antihistamine daily or daily as needed.   ?-Your options include Zyrtec (Cetirizine) 10mg , Claritin (Loratadine) 10mg , Allegra (Fexofenadine) 180mg , or Xyzal (Levocetirinze) 5mg  ?- Stop Sudafed (this also has a decongestant) ?- Consider nasal saline rinses as needed to help moisturize your nasal passages and rinse out pollens ?- sending in an Epipen (you will need to bring this to your allergy injection appointments) ? ? ?Allergic Conjunctivitis:  ?- Start Allergy Eye drops: great options include Pataday (Olopatadine) or Zaditor (ketotifen) for eye symptoms daily as needed-both sold over the counter if not covered by insurance.   ?-Avoid eye drops that say red eye relief as they may contain medications that dry out your eyes. ? ?Intermittent Asthma: ?- your lung testing today looked great! ?- Rescue Inhaler: Albuterol (Proair/Ventolin) 2 puffs . Use  every 4-6 hours as needed for chest tightness, wheezing, or coughing.  Can also use 15 minutes prior to exercise if you have symptoms with activity. ?- Asthma is not controlled if: ? - Symptoms are occurring >2 times a week OR ? - >2 times a month nighttime awakenings ? - You are requiring systemic steroids (prednisone/steroid injections) more than once per year ? - Your require hospitalization for your asthma. ? - Please call the clinic to schedule a follow up if these symptoms arise ? ?Follow-up in 4 weeks, sooner if needed.  ? ?

## 2021-07-28 NOTE — Addendum Note (Signed)
Addended by: Eloy End D on: 07/28/2021 04:59 PM ? ? Modules accepted: Orders ? ?

## 2021-08-11 ENCOUNTER — Encounter: Payer: Self-pay | Admitting: Internal Medicine

## 2021-08-11 ENCOUNTER — Other Ambulatory Visit: Payer: Self-pay

## 2021-08-11 ENCOUNTER — Telehealth (INDEPENDENT_AMBULATORY_CARE_PROVIDER_SITE_OTHER): Payer: 59 | Admitting: Internal Medicine

## 2021-08-11 VITALS — Ht 75.0 in | Wt 245.0 lb

## 2021-08-11 DIAGNOSIS — J029 Acute pharyngitis, unspecified: Secondary | ICD-10-CM

## 2021-08-11 MED ORDER — AMOXICILLIN-POT CLAVULANATE 875-125 MG PO TABS: 1.0000 | ORAL_TABLET | Freq: Two times a day (BID) | ORAL | 0 refills | Status: DC

## 2021-08-11 NOTE — Progress Notes (Signed)
Virtual Visit via Video Note ? ?I connected with Lance Rodriguez ? on 08/11/21 at  1:00 PM EDT by a video enabled telemedicine application and verified that I am speaking with the correct person using two identifiers. ? Location patient: [place] ?Location provider:work or home office ?Persons participating in the virtual visit: patient, provider ? ?I discussed the limitations and requested verbal permission for telemedicine visit. The patient expressed understanding and agreed to proceed. ? ? ?HPI: ? ?Acute telemedicine visit for : ?Sick visit seen at Urgent care Monday tested negative for covid, flu and strep has tonsils and c/o sore throat white spots since Monday tried hydrogen peroxide gargles and size has gone down some complains of fatigue. He works as an Airline pilot in [employer]  ? ? ?-Pertinent past medical history: see below ?-Pertinent medication allergies:No Known Allergies ?-COVID-19 vaccine status:  ?Immunization History  ?Administered Date(s) Administered  ? HPV 9-valent 11/20/2016  ? Influenza,inj,Quad PF,6+ Mos 04/27/2017  ? ? ? ?ROS: See pertinent positives and negatives per HPI. ? ?Past Medical History:  ?Diagnosis Date  ? ADHD   ? Asthma   ? Chickenpox   ? ? ?Past Surgical History:  ?Procedure Laterality Date  ? APPENDECTOMY    ? TYMPANOSTOMY TUBE PLACEMENT    ? ? ? ?Current Outpatient Medications:  ?  albuterol (PROVENTIL HFA;VENTOLIN HFA) 108 (90 Base) MCG/ACT inhaler, Inhale 2 puffs into the lungs every 6 (six) hours as needed for wheezing or shortness of breath., Disp: 1 Inhaler, Rfl: 0 ?  amoxicillin-clavulanate (AUGMENTIN) 875-125 MG tablet, Take 1 tablet by mouth 2 (two) times daily. With food, Disp: 14 tablet, Rfl: 0 ?  fluticasone (FLONASE) 50 MCG/ACT nasal spray, Place 2 sprays into both nostrils daily. (Patient not taking: Reported on 07/27/2021), Disp: 16 g, Rfl: 6 ?  phenylephrine (SUDAFED PE) 10 MG TABS tablet, Take 10 mg by mouth 3 (three) times daily as needed. (Patient not taking: Reported on 08/11/2021),  Disp: , Rfl:  ? ?EXAM: ? ?VITALS per patient if applicable: ? ?GENERAL: alert, oriented, appears ill but and in no acute distress ? ?HEENT: atraumatic, conjunttiva clear, no obvious abnormalities on inspection of external nose and ears ? ?NECK: normal movements of the head and neck ? ?LUNGS: on inspection no signs of respiratory distress, breathing rate appears normal, no obvious gross SOB, gasping or wheezing ? ?CV: no obvious cyanosis ? ?MS: moves all visible extremities without noticeable abnormality ? ?PSYCH/NEURO: pleasant and cooperative, no obvious depression or anxiety, speech and thought processing grossly intact ? ?ASSESSMENT AND PLAN: ? ?Discussed the following assessment and plan: ? ?Sore throat - Plan: Culture, Group A Strep, Mononucleosis screen, amoxicillin-clavulanate (AUGMENTIN) 875-125 MG tablet bid x 7 days  ? ?These are over the counter medication options:  ?Mucinex dm green label for cough or robitussin DM  ?Multivitamin or below vitamins  ?Vitamin C 1000 mg daily.  ?Vitamin D3 4000 Iu (units) daily.  ?Zinc 100 mg daily.  ?Quercetin 250-500 mg 2 times per day   ?Elderberry  ?Oil of oregano  ?cepacol or chloroseptic spray ?Warm salt water gargles +hydrogen peroxide ?Sugar free cough drops  ?Warm tea with honey and lemon  ?Hydration  ?Try to eat though you dont feel like it   ?Tylenol or Advil  ?Nasal saline and Flonase 2 sprays nasal congestion  ?If sneezing/runny nose over the counter allergy pill claritin,allegra, zyrtec, xyzal ?Quarantine x 10-14 days 14 days preferred  ? ?Monitor pulse oximeter, buy from [facility] if oxygen is less than 90  please go to the hospital.  ?   ?   ?Are you feeling really sick? Shortness of breath, cough, chest pain?, dizziness? Confusion  ? If so let me know  ?If worsening, go to hospital or [facility] clinic Urgent care for further treatment.    ? ?-we discussed possible serious and likely etiologies, options for evaluation and workup, limitations of telemedicine visit  vs in person visit, treatment, treatment risks and precautions. Pt is agreeable to treatment via telemedicine at this moment.  ?Work/School slipped offered: provided in patient instructions  yes ? ? ?I discussed the assessment and treatment plan with the patient. The patient was provided an opportunity to ask questions and all were answered. The patient agreed with the plan and demonstrated an understanding of the instructions. ?  ? ?Time spent 20 minutes ?Pasty Spillers McLean-Scocuzza, MD   ?

## 2021-08-11 NOTE — Patient Instructions (Signed)
?  These are over the counter medication options:  ?Mucinex dm green label for cough or robitussin DM  ?Multivitamin or below vitamins  ?Vitamin C 1000 mg daily.  ?Vitamin D3 4000 Iu (units) daily.  ?Zinc 100 mg daily.  ?Quercetin 250-500 mg 2 times per day   ?Elderberry  ?Oil of oregano  ?cepacol or chloroseptic spray ?Warm salt water gargles +hydrogen peroxide ?Sugar free cough drops  ?Warm tea with honey and lemon  ?Hydration  ?Try to eat though you dont feel like it   ?Tylenol or Advil  ?Nasal saline and Flonase 2 sprays nasal congestion  ?If sneezing/runny nose over the counter allergy pill claritin,allegra, zyrtec, xyzal ?Quarantine x 10-14 days 14 days preferred  ? ?Monitor pulse oximeter, buy from [employer] if oxygen is less than 90 please go to the hospital.  ?   ?   ?Are you feeling really sick? Shortness of breath, cough, chest pain?, dizziness? Confusion  ? If so let me know  ?If worsening, go to hospital or [facility] clinic Urgent care for further treatment.    ?

## 2021-08-12 ENCOUNTER — Encounter: Payer: Self-pay | Admitting: Internal Medicine

## 2021-08-12 LAB — MONONUCLEOSIS SCREEN: Mono Screen: POSITIVE — AB

## 2021-08-14 LAB — CULTURE, GROUP A STREP

## 2021-08-23 NOTE — Progress Notes (Deleted)
? ?FOLLOW UP ?Date of Service/Encounter:  08/23/21 ? ? ?Subjective:  ?Lance Rodriguez (DOB: 12-04-1995) is a 26 y.o. male who [facility] in re-evaluation of the following: *** ?History obtained from: chart review and {Persons; PED relatives w/patient:19415::"patient"}. ? ?For Review, LV was on 07/27/21  with Dr.Jerimyah Vandunk for initial visit.  Using sudafed and affrin at last visit which we discontinued.  Started singulair, OTC AH, pataday.   ? ?Pertinent History/Diagnostics:  ?- Asthma: intermittent ? -  spirometry (07/27/21-initial visit): ratio 106%, 97% FEV1  ?- Allergic Rhinitis:  ? - SPT environmental panel (07/27/21): positive to grasses, weeds, ragweed, trees, molds, dust mites, cats, dog, mixed feathers ? ?   ? ?Allergies as of 08/24/2021   ?No Known Allergies ?  ? ?  ?Medication List  ?  ? ?  ? Accurate as of August 23, 2021 12:45 PM. If you have any questions, ask your nurse or doctor.  ?  ?  ? ?  ? ?albuterol 108 (90 Base) MCG/ACT inhaler ?Commonly known as: VENTOLIN HFA ?Inhale 2 puffs into the lungs every 6 (six) hours as needed for wheezing or shortness of breath. ?  ?amoxicillin-clavulanate 875-125 MG tablet ?Commonly known as: Augmentin ?Take 1 tablet by mouth 2 (two) times daily. With food ?  ?fluticasone 50 MCG/ACT nasal spray ?Commonly known as: FLONASE ?Place 2 sprays into both nostrils daily. ?  ?phenylephrine 10 MG Tabs tablet ?Commonly known as: SUDAFED PE ?Take 10 mg by mouth 3 (three) times daily as needed. ?  ? ?  ? ?Past Medical History:  ?Diagnosis Date  ? ADHD   ? Asthma   ? Chickenpox   ? Mononucleosis   ? 08/11/21  ? ?Past Surgical History:  ?Procedure Laterality Date  ? APPENDECTOMY    ? TYMPANOSTOMY TUBE PLACEMENT    ? ?Otherwise, there have been no changes to his past medical history, surgical history, family history, or social history. ? ?ROS: All others negative except as noted per HPI.  ? ?Objective:  ?There were no vitals taken for this  visit. ?There is no height or weight on file to calculate BMI. ?Physical Exam: ?General Appearance:  Alert, cooperative, no distress, appears stated age  ?Head:  Normocephalic, without obvious abnormality, atraumatic  ?Eyes:  Conjunctiva clear, EOM's intact  ?Nose: Nares normal, {Blank multiple:19196:a:"***","hypertrophic turbinates","normal mucosa","no visible anterior polyps","septum midline"}  ?Throat: Lips, tongue normal; teeth and gums normal, {Blank multiple:19196:a:"***","normal posterior oropharynx","tonsils 2+","tonsils 3+","no tonsillar exudate","+ cobblestoning"}  ?Neck: Supple, symmetrical  ?Lungs:   {Blank multiple:19196:a:"***","clear to auscultation bilaterally","end-expiratory wheezing","wheezing throughout"}, Respirations unlabored, {Blank multiple:19196:a:"***","no coughing","intermittent dry coughing"}  ?Heart:  {Blank multiple:19196:a:"***","regular rate and rhythm","no murmur"}, Appears well perfused  ?Extremities: No edema  ?Skin: Skin color, texture, turgor normal, no rashes or lesions on visualized portions of skin  ?Neurologic: No gross deficits  ? ?Reviewed: *** ? ?Spirometry:  ?Tracings reviewed. His effort: {Blank single:19197::"Good reproducible efforts.","It was hard to get consistent efforts and there is a question as to whether this reflects a maximal maneuver.","Poor effort, data can not be interpreted.","Variable effort-results affected.","decent for first attempt at spirometry."} ?FVC: ***L ?FEV1: ***L, ***% predicted ?FEV1/FVC ratio: ***% ?Interpretation: {Blank single:19197::"Spirometry consistent with mild obstructive disease","Spirometry consistent with moderate obstructive disease","Spirometry consistent with severe obstructive disease","Spirometry consistent with possible restrictive disease","Spirometry consistent with mixed obstructive and restrictive disease","Spirometry uninterpretable due to technique","Spirometry consistent with normal pattern","No overt abnormalities  noted given today's efforts"}.  ?Please see scanned spirometry results for details. ? ?Skin  Testing: {Blank single:19197::"Select foods","Environmental allergy panel","Environmental allergy panel and select foods","Food allergy panel","None","Deferred due to recent antihistamines use"}. ?Positive test to: ***. Negative test to: ***.  ?Results discussed with patient/family. ? ? ?{Blank single:19197::"Allergy testing results were read and interpreted by myself, documented by clinical staff."," "} ? ?Assessment/Plan  ? ?*** ? ?Tonny Bollman, MD  ?[facility] ? ? ? ? ? ? ?

## 2021-08-24 ENCOUNTER — Ambulatory Visit: Payer: BC Managed Care – PPO | Admitting: Internal Medicine

## 2021-08-24 DIAGNOSIS — J309 Allergic rhinitis, unspecified: Secondary | ICD-10-CM

## 2021-09-12 NOTE — Progress Notes (Signed)
? ?FOLLOW UP ?Date of Service/Encounter:  09/14/21 ? ?Subjective:  ?Lance Rodriguez (DOB: 07-May-1996) is a 26 y.o. male PMHx of ADHD, obesity, asthma status post tympanostomy tube placement who returns to the [facility] [place] on 09/14/2021 in re-evaluation of the following: asthma and allergic rhinitis ?History obtained from: chart review and patient. ? ?For Review, LV was on 07/27/21  with Dr.Donis Pinder seen for initial visit.  Started on singulair, daily AH, discussed stopping sudafed.  Possible interest in AIT. ? ?Pertinent History/Diagnostics:  ?- Asthma: intermittent, dog is trigger, owns a dog ? -  spirometry (0301/23): ratio 106%, 97% FEV1 ?- Allergic Rhinitis and conjunctivitis: on chronic decongestants at initial visit  ? - SPT environmental panel (07/27/21): positive to grasses, weeds, ragweed, trees, molds, dust mites, cats, dog, mixed feathers ? ?Today presents for follow-up. ?Taking Xyzal when he really needs it.  Everything he is taking as needed, but will have symptoms anytime he is outside (for example playing golf, hunting, etc). ?He would like to move forward with allergy injections. ?He would like to be referred to ENT for deviated septum.  He has issues breathing out of his left nostril more than right despite using allergy medications. ?Asthma reported as controlled without need for rescue inhaler.  ? ?Allergies as of 09/14/2021   ?No Known Allergies ?  ? ?  ?Medication List  ?  ? ?  ? Accurate as of September 14, 2021 12:58 PM. If you have any questions, ask your nurse or doctor.  ?  ?  ? ?  ? ?STOP taking these medications   ? ?amoxicillin-clavulanate 875-125 MG tablet ?Commonly known as: Augmentin ?Stopped by: Sigurd Sos, MD ?  ?phenylephrine 10 MG Tabs tablet ?Commonly known as: SUDAFED PE ?Stopped by: Sigurd Sos, MD ?  ? ?  ? ?TAKE these medications   ? ?albuterol 108 (90 Base) MCG/ACT inhaler ?Commonly known as: VENTOLIN HFA ?Inhale 2 puffs into the lungs every 6 (six) hours as needed  for wheezing or shortness of breath. ?  ?fluticasone 50 MCG/ACT nasal spray ?Commonly known as: FLONASE ?Place 2 sprays into both nostrils daily. ?  ? ?  ? ?Past Medical History:  ?Diagnosis Date  ? ADHD   ? Asthma   ? Chickenpox   ? Mononucleosis   ? 08/11/21  ? ?Past Surgical History:  ?Procedure Laterality Date  ? APPENDECTOMY    ? TYMPANOSTOMY TUBE PLACEMENT    ? ?Otherwise, there have been no changes to his past medical history, surgical history, family history, or social history. ? ?ROS: All others negative except as noted per HPI.  ? ?Objective:  ?BP 110/76   Pulse 80   Temp 98 ?F (36.7 ?C) (Temporal)   Resp 18   SpO2 100%  ?There is no height or weight on file to calculate BMI. ?Physical Exam: ?General Appearance:  Alert, cooperative, no distress, appears stated age  ?Head:  Normocephalic, without obvious abnormality, atraumatic  ?Eyes:  Conjunctiva clear, EOM's intact  ?Nose: Nares normal, hypertrophic turbinates, normal mucosa, and no visible anterior polyps, leftward deviated septum  ?Throat: Lips, tongue normal; teeth and gums normal, normal posterior oropharynx  ?Neck: Supple, symmetrical  ?Lungs:   clear to auscultation bilaterally, Respirations unlabored, no coughing  ?Heart:  regular rate and rhythm and no murmur, Appears well perfused  ?Extremities: No edema  ?Skin: Skin color, texture, turgor normal, no rashes or lesions on visualized portions of skin  ?Neurologic: No gross deficits  ? ?Assessment/Plan  ? ?  Seasonal and perennial allergic rhinitis: partially controlled ?- allergen avoidance towards grasses, weeds, ragweed, trees, molds, dust mites, cats, dog, mixed feathers ?- allergy shots as long term control of your symptoms by teaching your immune system to be more tolerant of your allergy triggers ?- Continue Singulair (Montelukast) 10mg  nightly. ?- Continue over the counter antihistamine daily or daily as needed.   ?-Your options include Zyrtec (Cetirizine) 10mg , Claritin (Loratadine) 10mg ,  Allegra (Fexofenadine) 180mg , or Xyzal (Levocetirinze) 5mg  ?- Consider nasal saline rinses as needed to help moisturize your nasal passages and rinse out pollens ?- bring Epipen to your allergy injection appointments ? ?Deviated septum ?- consider breath right strips at night to help with sleep ?- will send referral to ENT for evaluation ? ?Allergic Conjunctivitis: partially controlled ?- Continue Allergy Eye drops: great options include Pataday (Olopatadine) or Zaditor (ketotifen) for eye symptoms daily as needed-both sold over the counter if not covered by insurance.   ?-Avoid eye drops that say red eye relief as they may contain medications that dry out your eyes. ? ?Intermittent Asthma:controlled ?- Rescue Inhaler: Albuterol (Proair/Ventolin) 2 puffs . Use  every 4-6 hours as needed for chest tightness, wheezing, or coughing.  Can also use 15 minutes prior to exercise if you have symptoms with activity. ?- Asthma is not controlled if: ? - Symptoms are occurring >2 times a week OR ? - >2 times a month nighttime awakenings ? - You are requiring systemic steroids (prednisone/steroid injections) more than once per year ? - Your require hospitalization for your asthma. ? - Please call the clinic to schedule a follow up if these symptoms arise ? ?Follow-up in 3 months with provider, sooner if needed.  ?Schedule first allergy injection for the next 3 to 4 weeks. ? ?Sigurd Sos, MD  ?[facility] ? ? ? ? ? ? ?

## 2021-09-14 ENCOUNTER — Encounter: Payer: Self-pay | Admitting: Internal Medicine

## 2021-09-14 ENCOUNTER — Ambulatory Visit (INDEPENDENT_AMBULATORY_CARE_PROVIDER_SITE_OTHER): Payer: BC Managed Care – PPO | Admitting: Internal Medicine

## 2021-09-14 VITALS — BP 110/76 | HR 80 | Temp 98.0°F | Resp 18

## 2021-09-14 DIAGNOSIS — J3089 Other allergic rhinitis: Secondary | ICD-10-CM

## 2021-09-14 DIAGNOSIS — J342 Deviated nasal septum: Secondary | ICD-10-CM | POA: Diagnosis not present

## 2021-09-14 DIAGNOSIS — H1013 Acute atopic conjunctivitis, bilateral: Secondary | ICD-10-CM | POA: Diagnosis not present

## 2021-09-14 DIAGNOSIS — J4520 Mild intermittent asthma, uncomplicated: Secondary | ICD-10-CM | POA: Diagnosis not present

## 2021-09-14 DIAGNOSIS — J302 Other seasonal allergic rhinitis: Secondary | ICD-10-CM

## 2021-09-14 NOTE — Patient Instructions (Addendum)
Seasonal and perennial allergic rhinitis: ?- allergen avoidance towards grasses, weeds, ragweed, trees, molds, dust mites, cats, dog, mixed feathers ?- allergy shots as long term control of your symptoms by teaching your immune system to be more tolerant of your allergy triggers ?- Continue Singulair (Montelukast) 10mg  nightly. ?- Continue over the counter antihistamine daily or daily as needed.   ?-Your options include Zyrtec (Cetirizine) 10mg , Claritin (Loratadine) 10mg , Allegra (Fexofenadine) 180mg , or Xyzal (Levocetirinze) 5mg  ?- Consider nasal saline rinses as needed to help moisturize your nasal passages and rinse out pollens ?- bring Epipen to your allergy injection appointments ? ?Deviated septum ?- consider breath right strips at night to help with sleep ?- will send referral to ENT for evaluation ? ?Allergic Conjunctivitis:  ?- Continue Allergy Eye drops: great options include Pataday (Olopatadine) or Zaditor (ketotifen) for eye symptoms daily as needed-both sold over the counter if not covered by insurance.   ?-Avoid eye drops that say red eye relief as they may contain medications that dry out your eyes. ? ?Intermittent Asthma: ?- Rescue Inhaler: Albuterol (Proair/Ventolin) 2 puffs . Use  every 4-6 hours as needed for chest tightness, wheezing, or coughing.  Can also use 15 minutes prior to exercise if you have symptoms with activity. ?- Asthma is not controlled if: ? - Symptoms are occurring >2 times a week OR ? - >2 times a month nighttime awakenings ? - You are requiring systemic steroids (prednisone/steroid injections) more than once per year ? - Your require hospitalization for your asthma. ? - Please call the clinic to schedule a follow up if these symptoms arise ? ?Follow-up in 3 months with provider, sooner if needed.  ?Schedule first allergy injection for the next 3 to 4 weeks. ?

## 2021-09-14 NOTE — Progress Notes (Signed)
Aeroallergen Immunotherapy  ? ?Ordering Provider: Dr. Tonny Bollman  ? ?Patient Details  ?Name: Lance Rodriguez  ?MRN: [id number]  ?Date of Birth: September 07, 1995  ? ?Order 2 of 2  ? ?Vial Label: M-Cat-DM  ? ?0.2 ml (Volume)  1:20 Concentration -- Alternaria alternata  ?0.2 ml (Volume)  1:20 Concentration -- Drechslera spicifera  ?0.2 ml (Volume)  1:40 Concentration -- Epicoccum nigrum  ?0.2 ml (Volume)  1:40 Concentration -- Phoma betae  ?0.5 ml (Volume)  1:10 Concentration -- Cat Hair  ?0.5 ml (Volume)   AU Concentration -- Mite Mix (DF 5,000 & DP 5,000)  ? ? ?1.8  ml Extract Subtotal  ?3.2  ml Diluent  ?5.0  ml Maintenance Total  ? ?Schedule:  B  ?Blue Vial (1:100,000): Schedule B (6 doses)  ?Yellow Vial (1:10,000): Schedule B (6 doses)  ?Green Vial (1:1,000): Schedule B (6 doses)  ?Red Vial (1:100): Schedule A (10 doses)  ? ?Special Instructions: normal B ?

## 2021-09-14 NOTE — Progress Notes (Signed)
VIALS EXP 09-16-22 ?

## 2021-09-14 NOTE — Progress Notes (Signed)
Aeroallergen Immunotherapy  ? ?Ordering Provider: Dr. Sigurd Sos  ? ?Patient Details  ?Name: Lance Rodriguez  ?MRN: [id number]  ?Date of Birth: [birth year]  ? ?Order 1 of 2  ? ?Vial Label: G-W-T-dog  ? ?0.3 ml (Volume)  BAU Concentration -- 7 Grass Mix* 100,000 ([address] [place], [place], [place], [place], [place], Sweet Vernal, [place]ia [place])  ?0.3 ml (Volume)  BAU Concentration -- [place] 10,000  ?0.2 ml (Volume)  1:20 Concentration -- [place]  ?0.3 ml (Volume)  1:20 Concentration -- Ragweed Mix  ?0.2 ml (Volume)  1:10 Concentration -- Plantain English  ?0.5 ml (Volume)  1:20 Concentration -- Weed Mix*  ?0.5 ml (Volume)  1:20 Concentration -- Eastern 10 Tree Mix (also Sweet Gum)  ?0.2 ml (Volume)  1:20 Concentration -- Box Elder  ?0.2 ml (Volume)  1:10 Concentration -- Cedar, red  ?0.2 ml (Volume)  1:10 Concentration -- Pecan Pollen  ?0.2 ml (Volume)  1:20 Concentration -- Walnut, Black Pollen  ?0.5 ml (Volume)  1:10 Concentration -- Dog Epithelia  ? ? ?3.6  ml Extract Subtotal  ?1.4  ml Diluent  ?5.0  ml Maintenance Total  ? ?Schedule:  B  ?Blue Vial (1:100,000): Schedule B (6 doses)  ?Yellow Vial (1:10,000): Schedule B (6 doses)  ?Green Vial (1:1,000): Schedule B (6 doses)  ?Red Vial (1:100): Schedule A (10 doses)  ? ?Special Instructions: normal B schedule ?

## 2021-09-15 ENCOUNTER — Telehealth: Payer: Self-pay | Admitting: Internal Medicine

## 2021-09-15 DIAGNOSIS — J3081 Allergic rhinitis due to animal (cat) (dog) hair and dander: Secondary | ICD-10-CM | POA: Diagnosis not present

## 2021-09-15 NOTE — Telephone Encounter (Signed)
-----   Message from Tonny Bollman, MD sent at 09/14/2021  1:01 PM EDT ----- ?Please place referral for ENT-no preference. ?

## 2021-09-15 NOTE — Telephone Encounter (Signed)
Referral has been placed to [facility])  ? ?[address] ?[address] ?[place] [address] ?Ph: [phone] ?Fax: [phone] ? ?Called & advised patient of referral placement. Provided patient with address & phone number to [facility]. Advised patient to reach out to their office to get scheduled.  ?Patient requested I send him information over mychart. Sent [employer].  ?

## 2021-09-16 DIAGNOSIS — J3081 Allergic rhinitis due to animal (cat) (dog) hair and dander: Secondary | ICD-10-CM | POA: Diagnosis not present

## 2021-10-05 ENCOUNTER — Ambulatory Visit (INDEPENDENT_AMBULATORY_CARE_PROVIDER_SITE_OTHER): Payer: BC Managed Care – PPO

## 2021-10-05 DIAGNOSIS — J309 Allergic rhinitis, unspecified: Secondary | ICD-10-CM

## 2021-10-27 ENCOUNTER — Ambulatory Visit (INDEPENDENT_AMBULATORY_CARE_PROVIDER_SITE_OTHER): Payer: BC Managed Care – PPO

## 2021-10-27 DIAGNOSIS — J309 Allergic rhinitis, unspecified: Secondary | ICD-10-CM | POA: Diagnosis not present

## 2021-11-04 ENCOUNTER — Ambulatory Visit (INDEPENDENT_AMBULATORY_CARE_PROVIDER_SITE_OTHER): Payer: BC Managed Care – PPO

## 2021-11-04 DIAGNOSIS — J309 Allergic rhinitis, unspecified: Secondary | ICD-10-CM

## 2021-11-10 ENCOUNTER — Ambulatory Visit (INDEPENDENT_AMBULATORY_CARE_PROVIDER_SITE_OTHER): Payer: BC Managed Care – PPO

## 2021-11-10 DIAGNOSIS — J309 Allergic rhinitis, unspecified: Secondary | ICD-10-CM | POA: Diagnosis not present

## 2021-11-17 ENCOUNTER — Ambulatory Visit (INDEPENDENT_AMBULATORY_CARE_PROVIDER_SITE_OTHER): Payer: BC Managed Care – PPO

## 2021-11-17 DIAGNOSIS — J309 Allergic rhinitis, unspecified: Secondary | ICD-10-CM

## 2021-11-24 ENCOUNTER — Ambulatory Visit (INDEPENDENT_AMBULATORY_CARE_PROVIDER_SITE_OTHER): Payer: BC Managed Care – PPO

## 2021-11-24 DIAGNOSIS — J309 Allergic rhinitis, unspecified: Secondary | ICD-10-CM | POA: Diagnosis not present

## 2021-12-08 ENCOUNTER — Ambulatory Visit (INDEPENDENT_AMBULATORY_CARE_PROVIDER_SITE_OTHER): Payer: BC Managed Care – PPO

## 2021-12-08 DIAGNOSIS — J309 Allergic rhinitis, unspecified: Secondary | ICD-10-CM | POA: Diagnosis not present

## 2021-12-12 NOTE — Progress Notes (Unsigned)
FOLLOW UP Date of Service/Encounter:  12/14/21   Subjective:  Lance Rodriguez (DOB: 1995-10-28) is a 26 y.o. male who [facility] in re-evaluation of the following: asthma and allergic rhinitis History obtained from: chart review and patient.  For Review, LV was on 09/14/21  with Dr.Dannette Kinkaid seen for routine follow-up.  Interested in AIT.  Referred to ENT for deviated septum.  Also advised breath right strips.   Today presents for follow-up. Asthma: Doing great, no ED, UC or systemic steroids required since his last visit.  Allergic rhinitis/conjunctivitis: Doing great, has been outdoors playing golf and playing with his dog and feels his symptoms are much improved since starting allergy injections. Tolerating injections well without adverse events. He is pleased with current progress. Recently returned from [place] with this family.  He is going to see ENT in a few weeks about his deviated septum. He is looking forward to that consultation.  He is receiving AIT. Currently in blue vial  ----------------------------------------- Pertinent History/Diagnostics:  - Asthma: intermittent, dog is trigger, owns a dog                -  spirometry (0301/23): ratio 106%, 97% FEV1 - Allergic Rhinitis and conjunctivitis: on chronic decongestants at initial visit. AIT started on 10/05/21.                - SPT environmental panel (07/27/21): positive to grasses, weeds, ragweed, trees, molds, dust mites, cats, dog, mixed feathers  Allergies as of 12/14/2021   No Known Allergies      Medication List        Accurate as of December 14, 2021  3:57 PM. If you have any questions, ask your nurse or doctor.          albuterol 108 (90 Base) MCG/ACT inhaler Commonly known as: VENTOLIN HFA Inhale 2 puffs into the lungs every 6 (six) hours as needed for wheezing or shortness of breath.   fluticasone 50 MCG/ACT nasal spray Commonly known as:  FLONASE Place 2 sprays into both nostrils daily.       Past Medical History:  Diagnosis Date   ADHD    Asthma    Chickenpox    Mononucleosis    08/11/21   Past Surgical History:  Procedure Laterality Date   APPENDECTOMY     TYMPANOSTOMY TUBE PLACEMENT     Otherwise, there have been no changes to his past medical history, surgical history, family history, or social history.  ROS: All others negative except as noted per HPI.   Objective:  BP 122/60   Pulse 85   Temp 98.3 F (36.8 C) (Temporal)   Resp 16   Wt 256 lb 9.6 oz (116.4 kg)   SpO2 98%   BMI 32.07 kg/m  Body mass index is 32.07 kg/m. Physical Exam: General Appearance:  Alert, cooperative, no distress, appears stated age  Head:  Normocephalic, without obvious abnormality, atraumatic  Eyes:  Conjunctiva clear, EOM's intact  Nose: Nares normal, hypertrophic turbinates, normal mucosa, and no visible anterior polyps  Throat: Lips, tongue normal; teeth and gums normal, normal posterior oropharynx  Neck: Supple, symmetrical  Lungs:   clear to auscultation bilaterally, Respirations unlabored, no coughing  Heart:  regular rate and rhythm and no murmur, Appears well perfused  Extremities: No edema  Skin: Skin color, texture, turgor normal, no rashes or lesions on visualized portions of skin  Neurologic: No gross deficits   Assessment/Plan  Seasonal and perennial allergic rhinitis: controlled - allergen avoidance towards grasses, weeds, ragweed, trees, molds, dust mites, cats, dog, mixed feathers - continue allergy injections per protocol - Continue Singulair (Montelukast) 10mg  nightly. - Continue over the counter antihistamine daily or daily as needed.   -Your options include Zyrtec (Cetirizine) 10mg , Claritin (Loratadine) 10mg , Allegra (Fexofenadine) 180mg , or Xyzal (Levocetirinze) 5mg  - Consider nasal saline rinses as needed to help moisturize your nasal passages and rinse out pollens - bring Epipen to your  allergy injection appointments  Deviated septum - consider breath right strips at night to help with sleep - follow-up with ENT as planned  Allergic Conjunctivitis:  - Continue Allergy Eye drops: great options include Pataday (Olopatadine) or Zaditor (ketotifen) for eye symptoms daily as needed-both sold over the counter if not covered by insurance.   -Avoid eye drops that say red eye relief as they may contain medications that dry out your eyes.  Intermittent Asthma: - Rescue Inhaler: Albuterol (Proair/Ventolin) 2 puffs . Use  every 4-6 hours as needed for chest tightness, wheezing, or coughing.  Can also use 15 minutes prior to exercise if you have symptoms with activity. - Asthma is not controlled if:  - Symptoms are occurring >2 times a week OR  - >2 times a month nighttime awakenings  - You are requiring systemic steroids (prednisone/steroid injections) more than once per year  - Your require hospitalization for your asthma.  - Please call the clinic to schedule a follow up if these symptoms arise  Follow-up in 6 months, sooner if needed.  It was a pleasure to see you today!   , MD  [facility]

## 2021-12-14 ENCOUNTER — Ambulatory Visit (INDEPENDENT_AMBULATORY_CARE_PROVIDER_SITE_OTHER): Payer: BC Managed Care – PPO | Admitting: Internal Medicine

## 2021-12-14 ENCOUNTER — Encounter: Payer: Self-pay | Admitting: Internal Medicine

## 2021-12-14 ENCOUNTER — Ambulatory Visit (INDEPENDENT_AMBULATORY_CARE_PROVIDER_SITE_OTHER): Payer: BC Managed Care – PPO | Admitting: *Deleted

## 2021-12-14 VITALS — BP 122/60 | HR 85 | Temp 98.3°F | Resp 16 | Wt 256.6 lb

## 2021-12-14 DIAGNOSIS — J309 Allergic rhinitis, unspecified: Secondary | ICD-10-CM | POA: Diagnosis not present

## 2021-12-14 DIAGNOSIS — J342 Deviated nasal septum: Secondary | ICD-10-CM | POA: Diagnosis not present

## 2021-12-14 DIAGNOSIS — J4520 Mild intermittent asthma, uncomplicated: Secondary | ICD-10-CM

## 2021-12-14 DIAGNOSIS — H1013 Acute atopic conjunctivitis, bilateral: Secondary | ICD-10-CM | POA: Diagnosis not present

## 2021-12-14 DIAGNOSIS — J3089 Other allergic rhinitis: Secondary | ICD-10-CM

## 2021-12-14 DIAGNOSIS — J302 Other seasonal allergic rhinitis: Secondary | ICD-10-CM

## 2021-12-14 NOTE — Patient Instructions (Addendum)
Seasonal and perennial allergic rhinitis: controlled - allergen avoidance towards grasses, weeds, ragweed, trees, molds, dust mites, cats, dog, mixed feathers - continue allergy injections per protocol - Continue Singulair (Montelukast) 10mg  nightly. - Continue over the counter antihistamine daily or daily as needed.   -Your options include Zyrtec (Cetirizine) 10mg , Claritin (Loratadine) 10mg , Allegra (Fexofenadine) 180mg , or Xyzal (Levocetirinze) 5mg  - Consider nasal saline rinses as needed to help moisturize your nasal passages and rinse out pollens - bring Epipen to your allergy injection appointments  Deviated septum - consider breath right strips at night to help with sleep - follow-up with ENT as planned  Allergic Conjunctivitis:  - Continue Allergy Eye drops: great options include Pataday (Olopatadine) or Zaditor (ketotifen) for eye symptoms daily as needed-both sold over the counter if not covered by insurance.   -Avoid eye drops that say red eye relief as they may contain medications that dry out your eyes.  Intermittent Asthma: - Rescue Inhaler: Albuterol (Proair/Ventolin) 2 puffs . Use  every 4-6 hours as needed for chest tightness, wheezing, or coughing.  Can also use 15 minutes prior to exercise if you have symptoms with activity. - Asthma is not controlled if:  - Symptoms are occurring >2 times a week OR  - >2 times a month nighttime awakenings  - You are requiring systemic steroids (prednisone/steroid injections) more than once per year  - Your require hospitalization for your asthma.  - Please call the clinic to schedule a follow up if these symptoms arise  Follow-up in 6 months, sooner if needed.  It was a pleasure to see you today!

## 2021-12-21 ENCOUNTER — Ambulatory Visit (INDEPENDENT_AMBULATORY_CARE_PROVIDER_SITE_OTHER): Payer: BC Managed Care – PPO

## 2021-12-21 DIAGNOSIS — J309 Allergic rhinitis, unspecified: Secondary | ICD-10-CM | POA: Diagnosis not present

## 2021-12-29 ENCOUNTER — Ambulatory Visit (INDEPENDENT_AMBULATORY_CARE_PROVIDER_SITE_OTHER): Payer: BC Managed Care – PPO

## 2021-12-29 DIAGNOSIS — J309 Allergic rhinitis, unspecified: Secondary | ICD-10-CM | POA: Diagnosis not present

## 2022-01-05 ENCOUNTER — Ambulatory Visit (INDEPENDENT_AMBULATORY_CARE_PROVIDER_SITE_OTHER): Payer: BC Managed Care – PPO

## 2022-01-05 DIAGNOSIS — J309 Allergic rhinitis, unspecified: Secondary | ICD-10-CM

## 2022-01-12 ENCOUNTER — Ambulatory Visit (INDEPENDENT_AMBULATORY_CARE_PROVIDER_SITE_OTHER): Payer: BC Managed Care – PPO

## 2022-01-12 DIAGNOSIS — J309 Allergic rhinitis, unspecified: Secondary | ICD-10-CM | POA: Diagnosis not present

## 2022-01-18 DIAGNOSIS — J34829 Nasal valve collapse, unspecified: Secondary | ICD-10-CM | POA: Insufficient documentation

## 2022-01-18 DIAGNOSIS — J302 Other seasonal allergic rhinitis: Secondary | ICD-10-CM | POA: Insufficient documentation

## 2022-01-18 DIAGNOSIS — J342 Deviated nasal septum: Secondary | ICD-10-CM | POA: Diagnosis not present

## 2022-01-18 DIAGNOSIS — J343 Hypertrophy of nasal turbinates: Secondary | ICD-10-CM | POA: Insufficient documentation

## 2022-01-18 DIAGNOSIS — M950 Acquired deformity of nose: Secondary | ICD-10-CM | POA: Diagnosis not present

## 2022-01-19 ENCOUNTER — Ambulatory Visit (INDEPENDENT_AMBULATORY_CARE_PROVIDER_SITE_OTHER): Payer: BC Managed Care – PPO

## 2022-01-19 DIAGNOSIS — J309 Allergic rhinitis, unspecified: Secondary | ICD-10-CM

## 2022-01-26 ENCOUNTER — Ambulatory Visit (INDEPENDENT_AMBULATORY_CARE_PROVIDER_SITE_OTHER): Payer: BC Managed Care – PPO

## 2022-01-26 DIAGNOSIS — J309 Allergic rhinitis, unspecified: Secondary | ICD-10-CM | POA: Diagnosis not present

## 2022-02-01 DIAGNOSIS — J343 Hypertrophy of nasal turbinates: Secondary | ICD-10-CM | POA: Diagnosis not present

## 2022-02-01 DIAGNOSIS — M950 Acquired deformity of nose: Secondary | ICD-10-CM | POA: Insufficient documentation

## 2022-02-01 DIAGNOSIS — J342 Deviated nasal septum: Secondary | ICD-10-CM | POA: Diagnosis not present

## 2022-02-01 DIAGNOSIS — J302 Other seasonal allergic rhinitis: Secondary | ICD-10-CM | POA: Diagnosis not present

## 2022-02-02 ENCOUNTER — Ambulatory Visit (INDEPENDENT_AMBULATORY_CARE_PROVIDER_SITE_OTHER): Payer: BC Managed Care – PPO

## 2022-02-02 DIAGNOSIS — J309 Allergic rhinitis, unspecified: Secondary | ICD-10-CM | POA: Diagnosis not present

## 2022-02-09 ENCOUNTER — Ambulatory Visit (INDEPENDENT_AMBULATORY_CARE_PROVIDER_SITE_OTHER): Payer: BC Managed Care – PPO

## 2022-02-09 DIAGNOSIS — J309 Allergic rhinitis, unspecified: Secondary | ICD-10-CM

## 2022-02-15 ENCOUNTER — Ambulatory Visit (INDEPENDENT_AMBULATORY_CARE_PROVIDER_SITE_OTHER): Payer: BC Managed Care – PPO | Admitting: *Deleted

## 2022-02-15 DIAGNOSIS — J309 Allergic rhinitis, unspecified: Secondary | ICD-10-CM

## 2022-02-22 ENCOUNTER — Ambulatory Visit (INDEPENDENT_AMBULATORY_CARE_PROVIDER_SITE_OTHER): Payer: BC Managed Care – PPO | Admitting: *Deleted

## 2022-02-22 DIAGNOSIS — J309 Allergic rhinitis, unspecified: Secondary | ICD-10-CM

## 2022-03-01 ENCOUNTER — Ambulatory Visit (INDEPENDENT_AMBULATORY_CARE_PROVIDER_SITE_OTHER): Payer: BC Managed Care – PPO

## 2022-03-01 DIAGNOSIS — J309 Allergic rhinitis, unspecified: Secondary | ICD-10-CM

## 2022-03-08 ENCOUNTER — Ambulatory Visit (INDEPENDENT_AMBULATORY_CARE_PROVIDER_SITE_OTHER): Payer: BC Managed Care – PPO

## 2022-03-08 DIAGNOSIS — J309 Allergic rhinitis, unspecified: Secondary | ICD-10-CM | POA: Diagnosis not present

## 2022-03-15 ENCOUNTER — Encounter: Payer: 59 | Admitting: Family Medicine

## 2022-03-15 ENCOUNTER — Ambulatory Visit (INDEPENDENT_AMBULATORY_CARE_PROVIDER_SITE_OTHER): Payer: BC Managed Care – PPO | Admitting: *Deleted

## 2022-03-15 DIAGNOSIS — J309 Allergic rhinitis, unspecified: Secondary | ICD-10-CM | POA: Diagnosis not present

## 2022-03-22 ENCOUNTER — Ambulatory Visit (INDEPENDENT_AMBULATORY_CARE_PROVIDER_SITE_OTHER): Payer: BC Managed Care – PPO | Admitting: *Deleted

## 2022-03-22 DIAGNOSIS — J309 Allergic rhinitis, unspecified: Secondary | ICD-10-CM

## 2022-03-29 ENCOUNTER — Ambulatory Visit (INDEPENDENT_AMBULATORY_CARE_PROVIDER_SITE_OTHER): Payer: BC Managed Care – PPO

## 2022-03-29 DIAGNOSIS — J309 Allergic rhinitis, unspecified: Secondary | ICD-10-CM

## 2022-04-05 ENCOUNTER — Ambulatory Visit (INDEPENDENT_AMBULATORY_CARE_PROVIDER_SITE_OTHER): Payer: BC Managed Care – PPO | Admitting: *Deleted

## 2022-04-05 DIAGNOSIS — J309 Allergic rhinitis, unspecified: Secondary | ICD-10-CM

## 2022-04-12 ENCOUNTER — Ambulatory Visit (INDEPENDENT_AMBULATORY_CARE_PROVIDER_SITE_OTHER): Payer: BC Managed Care – PPO | Admitting: *Deleted

## 2022-04-12 DIAGNOSIS — J309 Allergic rhinitis, unspecified: Secondary | ICD-10-CM

## 2022-04-19 ENCOUNTER — Ambulatory Visit (INDEPENDENT_AMBULATORY_CARE_PROVIDER_SITE_OTHER): Payer: BC Managed Care – PPO | Admitting: *Deleted

## 2022-04-19 DIAGNOSIS — J309 Allergic rhinitis, unspecified: Secondary | ICD-10-CM

## 2022-04-26 ENCOUNTER — Ambulatory Visit (INDEPENDENT_AMBULATORY_CARE_PROVIDER_SITE_OTHER): Payer: BC Managed Care – PPO

## 2022-04-26 DIAGNOSIS — J309 Allergic rhinitis, unspecified: Secondary | ICD-10-CM

## 2022-04-27 DIAGNOSIS — J342 Deviated nasal septum: Secondary | ICD-10-CM | POA: Diagnosis not present

## 2022-04-27 DIAGNOSIS — J343 Hypertrophy of nasal turbinates: Secondary | ICD-10-CM | POA: Diagnosis not present

## 2022-04-27 DIAGNOSIS — M950 Acquired deformity of nose: Secondary | ICD-10-CM | POA: Diagnosis not present

## 2022-04-27 DIAGNOSIS — J301 Allergic rhinitis due to pollen: Secondary | ICD-10-CM | POA: Diagnosis not present

## 2022-04-27 HISTORY — PX: SEPTOPLASTY: SUR1290

## 2022-05-03 ENCOUNTER — Encounter: Payer: 59 | Admitting: Family Medicine

## 2022-05-04 ENCOUNTER — Ambulatory Visit (INDEPENDENT_AMBULATORY_CARE_PROVIDER_SITE_OTHER): Payer: BC Managed Care – PPO

## 2022-05-04 DIAGNOSIS — J309 Allergic rhinitis, unspecified: Secondary | ICD-10-CM | POA: Diagnosis not present

## 2022-05-10 ENCOUNTER — Ambulatory Visit (INDEPENDENT_AMBULATORY_CARE_PROVIDER_SITE_OTHER): Payer: BC Managed Care – PPO

## 2022-05-10 DIAGNOSIS — J309 Allergic rhinitis, unspecified: Secondary | ICD-10-CM | POA: Diagnosis not present

## 2022-05-18 ENCOUNTER — Ambulatory Visit (INDEPENDENT_AMBULATORY_CARE_PROVIDER_SITE_OTHER): Payer: BC Managed Care – PPO

## 2022-05-18 DIAGNOSIS — J309 Allergic rhinitis, unspecified: Secondary | ICD-10-CM | POA: Diagnosis not present

## 2022-05-24 DIAGNOSIS — J3089 Other allergic rhinitis: Secondary | ICD-10-CM | POA: Diagnosis not present

## 2022-05-24 NOTE — Progress Notes (Signed)
VIALS EXP 05-25-23 

## 2022-05-25 ENCOUNTER — Ambulatory Visit (INDEPENDENT_AMBULATORY_CARE_PROVIDER_SITE_OTHER): Payer: BC Managed Care – PPO

## 2022-05-25 DIAGNOSIS — J309 Allergic rhinitis, unspecified: Secondary | ICD-10-CM | POA: Diagnosis not present

## 2022-06-07 ENCOUNTER — Ambulatory Visit (INDEPENDENT_AMBULATORY_CARE_PROVIDER_SITE_OTHER): Payer: BC Managed Care – PPO

## 2022-06-07 DIAGNOSIS — J309 Allergic rhinitis, unspecified: Secondary | ICD-10-CM

## 2022-06-13 NOTE — Progress Notes (Signed)
FOLLOW UP Date of Service/Encounter:  06/14/22   Subjective:  Lance Rodriguez (DOB: Nov 13, 1995) is a 27 y.o. male who [facility] on [facility]/14/2022 in re-evaluation of the following:  asthma and allergic rhinitis  History obtained from: chart review and patient.  For Review, LV was on 12/14/21  with Dr.Verania Salberg seen for routine follow-up. Asthma was controlled at that visit. ARC improved with AIT.  Plan to see ENT for deviated septum.  Last injectoin 06/07/22: red vial 0.5 mL  Today presents for follow-up. He is doing well since last visit. Tolerating AIT. Has been outside a lot during winter and fall without symptoms. Continues to be symptomatic around his dog, but outside of this is doing great. He did have septoplasty and no longer has follow-up with the ENT as surgery went well. HE is using Ayr gel currently for a few spots that are still bleeding, but otherwise no nasal sprays. He is only using albuterol infrequently, mostly in setting of dog. He is due for his injections today.  -------------------------------------------- Pertinent History/Diagnostics:  Asthma:  intermittent, dog is trigger, owns a dog -  spirometry (0301/23): ratio 106%, 97% FEV1 Allergic Rhinitis and conjunctivitis:  on chronic decongestants at initial visit. AIT started on 10/05/21. Reached maintenace dose (red 0.5 mL) on 05/18/22 - SPT environmental panel (07/27/21): positive to grasses, weeds, ragweed, trees, molds, dust mites, cats, dog, mixed feathers S/p endoscopic septoplasty and inferior turbinate reductions 04/2022  Allergies as of 06/14/2022   No Known Allergies      Medication List        Accurate as of June 14, 2022  4:55 PM. If you have any questions, ask your nurse or doctor.          albuterol 108 (90 Base) MCG/ACT inhaler Commonly known as: VENTOLIN HFA Inhale 2 puffs into the lungs every 6 (six) hours as needed for wheezing or shortness of  breath.   fluticasone 50 MCG/ACT nasal spray Commonly known as: FLONASE Place 2 sprays into both nostrils daily.   ibuprofen 600 MG tablet Commonly known as: ADVIL Take 600 mg by mouth as needed.   mupirocin ointment 2 % Commonly known as: BACTROBAN Apply 1 Application topically 2 (two) times daily.   sodium chloride 0.65 % nasal spray Commonly known as: OCEAN Place 2 sprays into the nose as needed.       Past Medical History:  Diagnosis Date   ADHD    Asthma    Chickenpox    Mononucleosis    08/11/21   Past Surgical History:  Procedure Laterality Date   APPENDECTOMY     SEPTOPLASTY N/A 04/27/2022   TYMPANOSTOMY TUBE PLACEMENT     Otherwise, there have been no changes to his past medical history, surgical history, family history, or social history.  ROS: All others negative except as noted per HPI.   Objective:  BP 126/78   Pulse 81   Temp 98.1 F (36.7 C) (Temporal)   Resp 16   Ht 6\' 3"  (1.905 m)   Wt 261 lb 9.6 oz (118.7 kg)   SpO2 98%   BMI 32.70 kg/m  Body mass index is 32.7 kg/m. Physical Exam: General Appearance:  Alert, cooperative, no distress, appears stated age  Head:  Normocephalic, without obvious abnormality, atraumatic  Eyes:  Conjunctiva clear, EOM's intact  Nose: Nares normal, hypertrophic turbinates, normal mucosa, no visible anterior polyps, and septum midline  Throat: Lips, tongue normal; teeth and gums normal,  normal posterior oropharynx  Neck: Supple, symmetrical  Lungs:   clear to auscultation bilaterally, Respirations unlabored, no coughing  Heart:  regular rate and rhythm and no murmur, Appears well perfused  Extremities: No edema  Skin: Skin color, texture, turgor normal, no rashes or lesions on visualized portions of skin  Neurologic: No gross deficits   Spirometry:  Tracings reviewed. His effort: It was hard to get consistent efforts and there is a question as to whether this reflects a maximal maneuver. FVC: 1.74L FEV1:  1.57L, 68% predicted FEV1/FVC ratio: 114% Interpretation:  nonobstructive ratio .  Please see scanned spirometry results for details.  Assessment/Plan   Seasonal and perennial allergic rhinitis: controlled - allergen avoidance towards grasses, weeds, ragweed, trees, molds, dust mites, cats, dog, mixed feathers - continue allergy injections per protocol - Continue Singulair (Montelukast) 10mg  nightly. - Continue over the counter antihistamine daily or daily as needed.   -Your options include Zyrtec (Cetirizine) 10mg , Claritin (Loratadine) 10mg , Allegra (Fexofenadine) 180mg , or Xyzal (Levocetirinze) 5mg  - Consider nasal saline rinses as needed to help moisturize your nasal passages and rinse out pollens - bring Epipen to your allergy injection appointment  Allergic Conjunctivitis: controlled - Continue Allergy Eye drops: great options include Pataday (Olopatadine) or Zaditor (ketotifen) for eye symptoms daily as needed-both sold over the counter if not covered by insurance.   -Avoid eye drops that say red eye relief as they may contain medications that dry out your eyes.  Intermittent Asthma: controlled - Rescue Inhaler: Albuterol (Proair/Ventolin) 2 puffs . Use  every 4-6 hours as needed for chest tightness, wheezing, or coughing.  Can also use 15 minutes prior to exercise if you have symptoms with activity. - Asthma is not controlled if:  - Symptoms are occurring >2 times a week OR  - >2 times a month nighttime awakenings  - You are requiring systemic steroids (prednisone/steroid injections) more than once per year  - Your require hospitalization for your asthma.  - Please call the clinic to schedule a follow up if these symptoms arise  Follow-up in 12 months, sooner if needed.  It was a pleasure to see you today!   Sigurd Sos, MD  [facility]

## 2022-06-14 ENCOUNTER — Ambulatory Visit (INDEPENDENT_AMBULATORY_CARE_PROVIDER_SITE_OTHER): Payer: BC Managed Care – PPO

## 2022-06-14 ENCOUNTER — Encounter: Payer: Self-pay | Admitting: Internal Medicine

## 2022-06-14 ENCOUNTER — Other Ambulatory Visit: Payer: Self-pay

## 2022-06-14 ENCOUNTER — Ambulatory Visit (INDEPENDENT_AMBULATORY_CARE_PROVIDER_SITE_OTHER): Payer: BC Managed Care – PPO | Admitting: Internal Medicine

## 2022-06-14 VITALS — BP 126/78 | HR 81 | Temp 98.1°F | Resp 16 | Ht 75.0 in | Wt 261.6 lb

## 2022-06-14 DIAGNOSIS — R0602 Shortness of breath: Secondary | ICD-10-CM

## 2022-06-14 DIAGNOSIS — J3089 Other allergic rhinitis: Secondary | ICD-10-CM | POA: Diagnosis not present

## 2022-06-14 DIAGNOSIS — J4520 Mild intermittent asthma, uncomplicated: Secondary | ICD-10-CM | POA: Diagnosis not present

## 2022-06-14 DIAGNOSIS — H1013 Acute atopic conjunctivitis, bilateral: Secondary | ICD-10-CM

## 2022-06-14 DIAGNOSIS — J309 Allergic rhinitis, unspecified: Secondary | ICD-10-CM

## 2022-06-14 DIAGNOSIS — J302 Other seasonal allergic rhinitis: Secondary | ICD-10-CM

## 2022-06-14 MED ORDER — EPINEPHRINE 0.3 MG/0.3ML IJ SOAJ: 0.3000 mg | INTRAMUSCULAR | 2 refills | Status: DC | PRN

## 2022-06-14 MED ORDER — ALBUTEROL SULFATE HFA 108 (90 BASE) MCG/ACT IN AERS: 2.0000 | INHALATION_SPRAY | Freq: Four times a day (QID) | RESPIRATORY_TRACT | 0 refills | Status: DC | PRN

## 2022-06-14 NOTE — Patient Instructions (Addendum)
Seasonal and perennial allergic rhinitis: controlled - allergen avoidance towards grasses, weeds, ragweed, trees, molds, dust mites, cats, dog, mixed feathers - continue allergy injections per protocol - Continue Singulair (Montelukast) 10mg  nightly. - Continue over the counter antihistamine daily or daily as needed.   -Your options include Zyrtec (Cetirizine) 10mg , Claritin (Loratadine) 10mg , Allegra (Fexofenadine) 180mg , or Xyzal (Levocetirinze) 5mg  - Consider nasal saline rinses as needed to help moisturize your nasal passages and rinse out pollens - bring Epipen to your allergy injection appointment  Allergic Conjunctivitis:  - Continue Allergy Eye drops: great options include Pataday (Olopatadine) or Zaditor (ketotifen) for eye symptoms daily as needed-both sold over the counter if not covered by insurance.   -Avoid eye drops that say red eye relief as they may contain medications that dry out your eyes.  Intermittent Asthma: - Rescue Inhaler: Albuterol (Proair/Ventolin) 2 puffs . Use  every 4-6 hours as needed for chest tightness, wheezing, or coughing.  Can also use 15 minutes prior to exercise if you have symptoms with activity. - Asthma is not controlled if:  - Symptoms are occurring >2 times a week OR  - >2 times a month nighttime awakenings  - You are requiring systemic steroids (prednisone/steroid injections) more than once per year  - Your require hospitalization for your asthma.  - Please call the clinic to schedule a follow up if these symptoms arise  Follow-up in 12 months, sooner if needed.  It was a pleasure to see you today!

## 2022-06-21 ENCOUNTER — Ambulatory Visit (INDEPENDENT_AMBULATORY_CARE_PROVIDER_SITE_OTHER): Payer: BC Managed Care – PPO

## 2022-06-21 DIAGNOSIS — J309 Allergic rhinitis, unspecified: Secondary | ICD-10-CM | POA: Diagnosis not present

## 2022-06-28 ENCOUNTER — Ambulatory Visit (INDEPENDENT_AMBULATORY_CARE_PROVIDER_SITE_OTHER): Payer: BC Managed Care – PPO

## 2022-06-28 DIAGNOSIS — J309 Allergic rhinitis, unspecified: Secondary | ICD-10-CM

## 2022-07-05 ENCOUNTER — Ambulatory Visit (INDEPENDENT_AMBULATORY_CARE_PROVIDER_SITE_OTHER): Payer: BC Managed Care – PPO

## 2022-07-05 DIAGNOSIS — J309 Allergic rhinitis, unspecified: Secondary | ICD-10-CM

## 2022-07-12 ENCOUNTER — Ambulatory Visit (INDEPENDENT_AMBULATORY_CARE_PROVIDER_SITE_OTHER): Payer: BC Managed Care – PPO

## 2022-07-12 DIAGNOSIS — J309 Allergic rhinitis, unspecified: Secondary | ICD-10-CM | POA: Diagnosis not present

## 2022-07-19 ENCOUNTER — Ambulatory Visit (INDEPENDENT_AMBULATORY_CARE_PROVIDER_SITE_OTHER): Payer: BC Managed Care – PPO

## 2022-07-19 DIAGNOSIS — J309 Allergic rhinitis, unspecified: Secondary | ICD-10-CM

## 2022-07-26 ENCOUNTER — Ambulatory Visit (INDEPENDENT_AMBULATORY_CARE_PROVIDER_SITE_OTHER): Payer: BC Managed Care – PPO

## 2022-07-26 DIAGNOSIS — J309 Allergic rhinitis, unspecified: Secondary | ICD-10-CM

## 2022-08-02 ENCOUNTER — Ambulatory Visit (INDEPENDENT_AMBULATORY_CARE_PROVIDER_SITE_OTHER): Payer: BC Managed Care – PPO

## 2022-08-02 DIAGNOSIS — J309 Allergic rhinitis, unspecified: Secondary | ICD-10-CM | POA: Diagnosis not present

## 2022-08-09 ENCOUNTER — Ambulatory Visit (INDEPENDENT_AMBULATORY_CARE_PROVIDER_SITE_OTHER): Payer: BC Managed Care – PPO

## 2022-08-09 DIAGNOSIS — J309 Allergic rhinitis, unspecified: Secondary | ICD-10-CM | POA: Diagnosis not present

## 2022-08-16 ENCOUNTER — Ambulatory Visit (INDEPENDENT_AMBULATORY_CARE_PROVIDER_SITE_OTHER): Payer: BC Managed Care – PPO

## 2022-08-16 DIAGNOSIS — J309 Allergic rhinitis, unspecified: Secondary | ICD-10-CM | POA: Diagnosis not present

## 2022-08-22 DIAGNOSIS — J3089 Other allergic rhinitis: Secondary | ICD-10-CM | POA: Diagnosis not present

## 2022-08-22 NOTE — Progress Notes (Signed)
VIALS EXP 08-22-23

## 2022-08-23 ENCOUNTER — Ambulatory Visit (INDEPENDENT_AMBULATORY_CARE_PROVIDER_SITE_OTHER): Payer: BC Managed Care – PPO

## 2022-08-23 DIAGNOSIS — J309 Allergic rhinitis, unspecified: Secondary | ICD-10-CM

## 2022-08-30 ENCOUNTER — Ambulatory Visit (INDEPENDENT_AMBULATORY_CARE_PROVIDER_SITE_OTHER): Payer: BC Managed Care – PPO

## 2022-08-30 DIAGNOSIS — J309 Allergic rhinitis, unspecified: Secondary | ICD-10-CM | POA: Diagnosis not present

## 2022-09-06 ENCOUNTER — Ambulatory Visit (INDEPENDENT_AMBULATORY_CARE_PROVIDER_SITE_OTHER): Payer: BC Managed Care – PPO

## 2022-09-06 DIAGNOSIS — J309 Allergic rhinitis, unspecified: Secondary | ICD-10-CM | POA: Diagnosis not present

## 2022-09-13 ENCOUNTER — Ambulatory Visit (INDEPENDENT_AMBULATORY_CARE_PROVIDER_SITE_OTHER): Payer: BC Managed Care – PPO

## 2022-09-13 DIAGNOSIS — J309 Allergic rhinitis, unspecified: Secondary | ICD-10-CM | POA: Diagnosis not present

## 2022-09-20 ENCOUNTER — Ambulatory Visit (INDEPENDENT_AMBULATORY_CARE_PROVIDER_SITE_OTHER): Payer: BC Managed Care – PPO

## 2022-09-20 DIAGNOSIS — J309 Allergic rhinitis, unspecified: Secondary | ICD-10-CM

## 2022-09-27 ENCOUNTER — Ambulatory Visit (INDEPENDENT_AMBULATORY_CARE_PROVIDER_SITE_OTHER): Payer: BC Managed Care – PPO

## 2022-09-27 DIAGNOSIS — J309 Allergic rhinitis, unspecified: Secondary | ICD-10-CM | POA: Diagnosis not present

## 2022-10-04 ENCOUNTER — Ambulatory Visit (INDEPENDENT_AMBULATORY_CARE_PROVIDER_SITE_OTHER): Payer: BC Managed Care – PPO

## 2022-10-04 DIAGNOSIS — J309 Allergic rhinitis, unspecified: Secondary | ICD-10-CM

## 2022-10-18 ENCOUNTER — Ambulatory Visit (INDEPENDENT_AMBULATORY_CARE_PROVIDER_SITE_OTHER): Payer: BC Managed Care – PPO

## 2022-10-18 DIAGNOSIS — J309 Allergic rhinitis, unspecified: Secondary | ICD-10-CM | POA: Diagnosis not present

## 2022-11-02 ENCOUNTER — Ambulatory Visit (INDEPENDENT_AMBULATORY_CARE_PROVIDER_SITE_OTHER): Payer: BC Managed Care – PPO

## 2022-11-02 DIAGNOSIS — J309 Allergic rhinitis, unspecified: Secondary | ICD-10-CM

## 2022-11-15 ENCOUNTER — Ambulatory Visit (INDEPENDENT_AMBULATORY_CARE_PROVIDER_SITE_OTHER): Payer: BC Managed Care – PPO

## 2022-11-15 DIAGNOSIS — J309 Allergic rhinitis, unspecified: Secondary | ICD-10-CM

## 2022-11-22 DIAGNOSIS — J3081 Allergic rhinitis due to animal (cat) (dog) hair and dander: Secondary | ICD-10-CM | POA: Diagnosis not present

## 2022-11-22 NOTE — Progress Notes (Signed)
VIALS EXP 11-22-23 

## 2022-12-06 ENCOUNTER — Ambulatory Visit: Payer: Self-pay

## 2022-12-06 DIAGNOSIS — J309 Allergic rhinitis, unspecified: Secondary | ICD-10-CM | POA: Diagnosis not present

## 2022-12-27 ENCOUNTER — Ambulatory Visit (INDEPENDENT_AMBULATORY_CARE_PROVIDER_SITE_OTHER): Payer: BC Managed Care – PPO

## 2022-12-27 DIAGNOSIS — J309 Allergic rhinitis, unspecified: Secondary | ICD-10-CM

## 2023-01-11 ENCOUNTER — Ambulatory Visit (INDEPENDENT_AMBULATORY_CARE_PROVIDER_SITE_OTHER): Payer: BC Managed Care – PPO | Admitting: *Deleted

## 2023-01-11 DIAGNOSIS — J309 Allergic rhinitis, unspecified: Secondary | ICD-10-CM | POA: Diagnosis not present

## 2023-01-24 ENCOUNTER — Ambulatory Visit (INDEPENDENT_AMBULATORY_CARE_PROVIDER_SITE_OTHER): Payer: BC Managed Care – PPO | Admitting: *Deleted

## 2023-01-24 DIAGNOSIS — J309 Allergic rhinitis, unspecified: Secondary | ICD-10-CM | POA: Diagnosis not present

## 2023-01-31 ENCOUNTER — Ambulatory Visit (INDEPENDENT_AMBULATORY_CARE_PROVIDER_SITE_OTHER): Payer: BC Managed Care – PPO

## 2023-01-31 DIAGNOSIS — J309 Allergic rhinitis, unspecified: Secondary | ICD-10-CM

## 2023-02-07 ENCOUNTER — Ambulatory Visit (INDEPENDENT_AMBULATORY_CARE_PROVIDER_SITE_OTHER): Payer: BC Managed Care – PPO | Admitting: *Deleted

## 2023-02-07 DIAGNOSIS — J309 Allergic rhinitis, unspecified: Secondary | ICD-10-CM

## 2023-02-14 ENCOUNTER — Ambulatory Visit (INDEPENDENT_AMBULATORY_CARE_PROVIDER_SITE_OTHER): Payer: Self-pay

## 2023-02-14 DIAGNOSIS — J309 Allergic rhinitis, unspecified: Secondary | ICD-10-CM | POA: Diagnosis not present

## 2023-03-07 ENCOUNTER — Ambulatory Visit (INDEPENDENT_AMBULATORY_CARE_PROVIDER_SITE_OTHER): Payer: Self-pay | Admitting: *Deleted

## 2023-03-07 DIAGNOSIS — J309 Allergic rhinitis, unspecified: Secondary | ICD-10-CM

## 2023-03-15 ENCOUNTER — Ambulatory Visit (INDEPENDENT_AMBULATORY_CARE_PROVIDER_SITE_OTHER): Payer: BC Managed Care – PPO | Admitting: *Deleted

## 2023-03-15 DIAGNOSIS — J309 Allergic rhinitis, unspecified: Secondary | ICD-10-CM

## 2023-03-21 ENCOUNTER — Ambulatory Visit (INDEPENDENT_AMBULATORY_CARE_PROVIDER_SITE_OTHER): Payer: Self-pay | Admitting: *Deleted

## 2023-03-21 DIAGNOSIS — J309 Allergic rhinitis, unspecified: Secondary | ICD-10-CM | POA: Diagnosis not present

## 2023-03-28 ENCOUNTER — Ambulatory Visit (INDEPENDENT_AMBULATORY_CARE_PROVIDER_SITE_OTHER): Payer: Self-pay | Admitting: *Deleted

## 2023-03-28 DIAGNOSIS — J309 Allergic rhinitis, unspecified: Secondary | ICD-10-CM

## 2023-04-30 DIAGNOSIS — Z6831 Body mass index (BMI) 31.0-31.9, adult: Secondary | ICD-10-CM | POA: Diagnosis not present

## 2023-04-30 DIAGNOSIS — J0100 Acute maxillary sinusitis, unspecified: Secondary | ICD-10-CM | POA: Diagnosis not present

## 2023-05-09 ENCOUNTER — Ambulatory Visit (INDEPENDENT_AMBULATORY_CARE_PROVIDER_SITE_OTHER): Payer: Self-pay

## 2023-05-09 DIAGNOSIS — J309 Allergic rhinitis, unspecified: Secondary | ICD-10-CM

## 2023-06-07 ENCOUNTER — Ambulatory Visit (INDEPENDENT_AMBULATORY_CARE_PROVIDER_SITE_OTHER): Payer: BC Managed Care – PPO

## 2023-06-07 DIAGNOSIS — J309 Allergic rhinitis, unspecified: Secondary | ICD-10-CM | POA: Diagnosis not present

## 2023-07-31 ENCOUNTER — Telehealth: Payer: Self-pay

## 2023-07-31 MED ORDER — EPINEPHRINE 0.3 MG/0.3ML IJ SOAJ: 0.3000 mg | INTRAMUSCULAR | 2 refills | Status: AC | PRN

## 2023-07-31 NOTE — Addendum Note (Signed)
 Addended by: Robet Leu A on: 07/31/2023 07:03 PM   Modules accepted: Orders

## 2023-07-31 NOTE — Telephone Encounter (Signed)
 Called and notified patient of the Epipen refill.

## 2023-07-31 NOTE — Telephone Encounter (Signed)
 Patient is on allergy shots (last one on 06/07/23) and hasn't been coming in because his epi pen expired. I scheduled the patient to see Thurston Hole on 08/06/2023.  Patient is wondering if the Epi Pen can be sent in before the visit so he can start coming back in?  [facility].

## 2023-08-02 NOTE — Patient Instructions (Addendum)
 Asthma Continue albuterol 2 puffs once every 4 hours if needed for cough or wheeze You may use albuterol 2 puffs 5 to 15 minutes before activity to decrease cough or wheeze  Allergic rhinitis Continue allergen avoidance measures directed toward grass pollen, weed pollen, tree pollen, cat, dog, mold, and dust mite as listed below Continue allergen immunotherapy and have access to an epinephrine autoinjector set per protocol Restart montelukast 10 mg once a day to control allergy symptoms Continue an antihistamine once a day if needed for runny nose or itch.  You may take an additional dose of antihistamine once a day if needed for breakthrough symptoms. Remember to rotate to a different antihistamine about every 3 months. Some examples of over the counter antihistamines include Zyrtec (cetirizine), Xyzal (levocetirizine), Allegra (fexofenadine), and Claritin (loratidine).  Consider saline nasal rinses as needed for nasal symptoms. Use this before any medicated nasal sprays for best result Consider saline nasal gel as needed for dry nostrils  Allergic conjunctivitis Some over the counter eye drops include Pataday one drop in each eye once a day as needed for red, itchy eyes OR Zaditor one drop in each eye twice a day as needed for red itchy eyes. Avoid eye drops that say red eye relief as they may contain medications that dry out your eyes.   Epistaxis Pinch both nostrils while leaning forward for at least 5 minutes before checking to see if the bleeding has stopped. If bleeding is not controlled within 5-10 minutes apply a cotton ball soaked with oxymetazoline (Afrin) to the bleeding nostril for a few seconds.  If the problem persists or worsens a referral to ENT for further evaluation may be necessary.   Eustachian tube dysfunction Begin nasal saline rinses as needed Continue an antihistamine as needed  Call the clinic if this treatment plan is not working well for you.  Follow up in 6  months or sooner if needed.  Reducing Pollen Exposure The American Academy of Allergy, Asthma and Immunology suggests the following steps to reduce your exposure to pollen during allergy seasons. Do not hang sheets or clothing out to dry; pollen may collect on these items. Do not mow lawns or spend time around freshly cut grass; mowing stirs up pollen. Keep windows closed at night.  Keep car windows closed while driving. Minimize morning activities outdoors, a time when pollen counts are usually at their highest. Stay indoors as much as possible when pollen counts or humidity is high and on windy days when pollen tends to remain in the air longer. Use air conditioning when possible.  Many air conditioners have filters that trap the pollen spores. Use a HEPA room air filter to remove pollen form the indoor air you breathe.  Control of Mold Allergen Mold and fungi can grow on a variety of surfaces provided certain temperature and moisture conditions exist.  Outdoor molds grow on plants, decaying vegetation and soil.  The major outdoor mold, Alternaria and Cladosporium, are found in very high numbers during hot and dry conditions.  Generally, a late Summer - Fall peak is seen for common outdoor fungal spores.  Rain will temporarily lower outdoor mold spore count, but counts rise rapidly when the rainy period ends.  The most important indoor molds are Aspergillus and Penicillium.  Dark, humid and poorly ventilated basements are ideal sites for mold growth.  The next most common sites of mold growth are the bathroom and the kitchen.  Outdoor [employer] Use air conditioning and keep  windows closed Avoid exposure to decaying vegetation. Avoid leaf raking. Avoid grain handling. Consider wearing a face mask if working in moldy areas.  Indoor Mold Control Maintain humidity below 50%. Clean washable surfaces with 5% bleach solution. Remove sources e.g. Contaminated carpets.  Control of Dog or Cat  Allergen Avoidance is the best way to manage a dog or cat allergy. If you have a dog or cat and are allergic to dog or cats, consider removing the dog or cat from the home. If you have a dog or cat but don't want to find it a new home, or if your family wants a pet even though someone in the household is allergic, here are some strategies that may help keep symptoms at bay:  Keep the pet out of your bedroom and restrict it to only a few rooms. Be advised that keeping the dog or cat in only one room will not limit the allergens to that room. Don't pet, hug or kiss the dog or cat; if you do, wash your hands with soap and water. High-efficiency particulate air (HEPA) cleaners run continuously in a bedroom or living room can reduce allergen levels over time. Regular use of a high-efficiency vacuum cleaner or a central vacuum can reduce allergen levels. Giving your dog or cat a bath at least once a week can reduce airborne allergen.   Control of Dust Mite Allergen Dust mites play a major role in allergic asthma and rhinitis. They occur in environments with high humidity wherever human skin is found. Dust mites absorb humidity from the atmosphere (ie, they do not drink) and feed on organic matter (including shed human and animal skin). Dust mites are a microscopic type of insect that you cannot see with the naked eye. High levels of dust mites have been detected from mattresses, pillows, carpets, upholstered furniture, bed covers, clothes, soft toys and any woven material. The principal allergen of the dust mite is found in its feces. A gram of dust may contain 1,000 mites and 250,000 fecal particles. Mite antigen is easily measured in the air during house cleaning activities. Dust mites do not bite and do not cause harm to humans, other than by triggering allergies/asthma.  Ways to decrease your exposure to dust mites in your home:  1. Encase mattresses, box springs and pillows with a mite-impermeable  barrier or cover  2. Wash sheets, blankets and drapes weekly in hot water (130 F) with detergent and dry them in a dryer on the hot setting.  3. Have the room cleaned frequently with a vacuum cleaner and a damp dust-mop. For carpeting or rugs, vacuuming with a vacuum cleaner equipped with a high-efficiency particulate air (HEPA) filter. The dust mite allergic individual should not be in a room which is being cleaned and should wait 1 hour after cleaning before going into the room.  4. Do not sleep on upholstered furniture (eg, couches).  5. If possible removing carpeting, upholstered furniture and drapery from the home is ideal. Horizontal blinds should be eliminated in the rooms where the person spends the most time (bedroom, study, television room). Washable vinyl, roller-type shades are optimal.  6. Remove all non-washable stuffed toys from the bedroom. Wash stuffed toys weekly like sheets and blankets above.  7. Reduce indoor humidity to less than 50%. Inexpensive humidity monitors can be purchased at most hardware stores. Do not use a humidifier as can make the problem worse and are not recommended.

## 2023-08-02 NOTE — Progress Notes (Signed)
 [address] [place] [address] Dept: [phone]  FOLLOW UP NOTE  Patient ID: Lance Rodriguez, male    DOB: [birth year]  Age: 28 y.o. MRN: [id number] Date of Office Visit: 08/06/2023  Assessment  Chief Complaint: Follow-up (Having some allergy related symptoms possibly. Tightness on sides of neck extending up the ears about 2 months. Causes headaches. Lots of sinus drainage in the morning. )  HPI Lance Rodriguez is a 29 year old male who presents to the clinic for a follow-up visit.  He was last seen in this clinic on 06/14/2022 by Dr. Maurine Minister for evaluation of asthma, allergic rhinitis on allergen immunotherapy, allergic conjunctivitis, and epistaxis.    At today's visit, he reports his asthma has been well-controlled with no symptoms including shortness of breath, cough, or wheeze with activity or rest.  He does report occasional wheezing while playing with his parent's dog for which he uses albuterol with relief of symptoms.  He reports this occurs less than 5 times a year.    Allergic rhinitis is reported as moderately well-controlled with symptoms including rhinorrhea, nasal congestion, and postnasal drainage that began about 1 to 2 months ago.  He reports that he began using an over-the-counter antihistamine about 2 weeks ago with some relief.  He is not currently using nasal saline rinses or nasal saline gel.  He does report that he quit taking montelukast about a year ago, however, did not have any adverse reaction while taking montelukast.  He continues allergen immunotherapy with no large or local reactions.  He reports a significant decrease in his symptoms of allergic rhinitis while continuing on allergen immunotherapy. He began allergen immunotherapy directed toward grass pollen, weed pollen, tree pollen, mold, cat, dog, and dust mite on 10/05/2021. Of note, his last injection was in January 2025.  He reports that he intends to continue allergen immunotherapy, however, has been  very busy lately.  EpiPen set is up-to-date.  He reports tightness in his throat occurring from the base of his throat and extending up to both the ears which occurs most days over the last 2 months.  He denies sore throat, however, reports infrequent sharp pain and scratchy voice when singing.  He denies recent fever, sweats, chills, or sick contacts.  He reports the sore throat symptoms and headaches have started to resolve slowly after beginning antihistamines about 2 weeks ago.  He denies symptoms of reflux including heartburn or vomiting.  He is not currently taking medication to control reflux.  He reports bilateral ear popping and crackling.  He denies ear pain or difficulty hearing.  He denies drainage from either ear.  Allergic conjunctivitis is reported as moderately well-controlled with occasional red and itchy eyes for which she is not currently using any medical intervention.  Epistaxis is reported as moderately well-controlled with occasional pink streaks noted on tissues especially during the winter.  He is not currently using a steroid nasal spray and only occasionally uses saline nasal rinse.  His current medications are listed in the chart.  Drug Allergies:  No Known Allergies  Physical Exam: BP 122/72 (BP Location: Right Arm, Patient Position: Sitting, Cuff Size: Large)   Pulse 85   Temp 97.9 F (36.6 C) (Oral)   Resp 20   Ht 6' 0.75" (1.848 m)   Wt 268 lb 4.8 oz (121.7 kg)   SpO2 99%   BMI 35.64 kg/m    Physical Exam Vitals reviewed.  Constitutional:      Appearance: Normal appearance.  HENT:     Head: Normocephalic and atraumatic.     Ears:     Comments: Bilateral clear effusion    Nose:     Comments: Bilateral naris edematous and pale with thin clear nasal drainage noted.  Pharynx slightly erythematous with no exudate.  Eyes normal. Eyes:     Conjunctiva/sclera: Conjunctivae normal.  Cardiovascular:     Rate and Rhythm: Normal rate and regular rhythm.      Heart sounds: Normal heart sounds. No murmur heard. Pulmonary:     Effort: Pulmonary effort is normal.     Breath sounds: Normal breath sounds.     Comments: Lungs clear to auscultation Musculoskeletal:        General: Normal range of motion.     Cervical back: Normal range of motion and neck supple.  Skin:    General: Skin is warm and dry.  Neurological:     Mental Status: He is alert and oriented to person, place, and time.  Psychiatric:        Mood and Affect: Mood normal.        Behavior: Behavior normal.        Thought Content: Thought content normal.        Judgment: Judgment normal.     Diagnostics: FVC 6.32 which is 103% of predicted value, FEV1 5.25 which is 104% of predicted value.  Spirometry indicates normal ventilatory function.  Assessment and Plan: 1. Mild intermittent asthma without complication   2. Seasonal and perennial allergic rhinitis   3. Allergic conjunctivitis of both eyes   4. Epistaxis     Meds ordered this encounter  Medications   albuterol (VENTOLIN HFA) 108 (90 Base) MCG/ACT inhaler    Sig: Inhale 2 puffs into the lungs every 6 (six) hours as needed for wheezing or shortness of breath.    Dispense:  18 g    Refill:  1   montelukast (SINGULAIR) 10 MG tablet    Sig: Take 1 tablet (10 mg total) by mouth at bedtime.    Dispense:  30 tablet    Refill:  5    Patient Instructions  Asthma Continue albuterol 2 puffs once every 4 hours if needed for cough or wheeze You may use albuterol 2 puffs 5 to 15 minutes before activity to decrease cough or wheeze  Allergic rhinitis Continue allergen avoidance measures directed toward grass pollen, weed pollen, tree pollen, cat, dog, mold, and dust mite as listed below Continue allergen immunotherapy and have access to an epinephrine autoinjector set per protocol Restart montelukast 10 mg once a day to control allergy symptoms Continue an antihistamine once a day if needed for runny nose or itch.  You may  take an additional dose of antihistamine once a day if needed for breakthrough symptoms. Remember to rotate to a different antihistamine about every 3 months. Some examples of over the counter antihistamines include Zyrtec (cetirizine), Xyzal (levocetirizine), Allegra (fexofenadine), and Claritin (loratidine).  Consider saline nasal rinses as needed for nasal symptoms. Use this before any medicated nasal sprays for best result Consider saline nasal gel as needed for dry nostrils  Allergic conjunctivitis Some over the counter eye drops include Pataday one drop in each eye once a day as needed for red, itchy eyes OR Zaditor one drop in each eye twice a day as needed for red itchy eyes. Avoid eye drops that say red eye relief as they may contain medications that dry out your eyes.   Epistaxis Pinch both  nostrils while leaning forward for at least 5 minutes before checking to see if the bleeding has stopped. If bleeding is not controlled within 5-10 minutes apply a cotton ball soaked with oxymetazoline (Afrin) to the bleeding nostril for a few seconds.  If the problem persists or worsens a referral to ENT for further evaluation may be necessary.   Eustachian tube dysfunction Begin nasal saline rinses as needed Continue an antihistamine as needed  Call the clinic if this treatment plan is not working well for you.  Follow up in 6 months or sooner if needed.   Return in about 6 months (around 02/06/2024), or if symptoms worsen or fail to improve.    Thank you for the opportunity to care for this patient.  Please do not hesitate to contact me with questions.  Thermon Leyland, FNP [facility]

## 2023-08-06 ENCOUNTER — Encounter: Payer: Self-pay | Admitting: Family Medicine

## 2023-08-06 ENCOUNTER — Other Ambulatory Visit: Payer: Self-pay

## 2023-08-06 ENCOUNTER — Ambulatory Visit (INDEPENDENT_AMBULATORY_CARE_PROVIDER_SITE_OTHER): Admitting: Family Medicine

## 2023-08-06 VITALS — BP 122/72 | HR 85 | Temp 97.9°F | Resp 20 | Ht 72.75 in | Wt 268.3 lb

## 2023-08-06 DIAGNOSIS — J4520 Mild intermittent asthma, uncomplicated: Secondary | ICD-10-CM

## 2023-08-06 DIAGNOSIS — H1013 Acute atopic conjunctivitis, bilateral: Secondary | ICD-10-CM | POA: Diagnosis not present

## 2023-08-06 DIAGNOSIS — J3089 Other allergic rhinitis: Secondary | ICD-10-CM

## 2023-08-06 DIAGNOSIS — J302 Other seasonal allergic rhinitis: Secondary | ICD-10-CM | POA: Diagnosis not present

## 2023-08-06 DIAGNOSIS — R040 Epistaxis: Secondary | ICD-10-CM

## 2023-08-06 MED ORDER — MONTELUKAST SODIUM 10 MG PO TABS: 10.0000 mg | ORAL_TABLET | Freq: Every day | ORAL | 5 refills | Status: DC

## 2023-08-06 MED ORDER — ALBUTEROL SULFATE HFA 108 (90 BASE) MCG/ACT IN AERS: 2.0000 | INHALATION_SPRAY | Freq: Four times a day (QID) | RESPIRATORY_TRACT | 1 refills | Status: AC | PRN

## 2023-08-13 DIAGNOSIS — J3081 Allergic rhinitis due to animal (cat) (dog) hair and dander: Secondary | ICD-10-CM | POA: Diagnosis not present

## 2023-08-13 NOTE — Progress Notes (Signed)
 VIALS MADE 08-13-23. EXP 08-12-24

## 2023-08-14 DIAGNOSIS — J3089 Other allergic rhinitis: Secondary | ICD-10-CM | POA: Diagnosis not present

## 2023-08-17 ENCOUNTER — Ambulatory Visit (INDEPENDENT_AMBULATORY_CARE_PROVIDER_SITE_OTHER): Payer: Self-pay

## 2023-08-17 DIAGNOSIS — J309 Allergic rhinitis, unspecified: Secondary | ICD-10-CM | POA: Diagnosis not present

## 2023-08-29 ENCOUNTER — Ambulatory Visit (INDEPENDENT_AMBULATORY_CARE_PROVIDER_SITE_OTHER): Payer: Self-pay | Admitting: *Deleted

## 2023-08-29 DIAGNOSIS — J309 Allergic rhinitis, unspecified: Secondary | ICD-10-CM | POA: Diagnosis not present

## 2023-10-09 ENCOUNTER — Encounter: Payer: Self-pay | Admitting: Family Medicine

## 2023-10-10 ENCOUNTER — Telehealth: Payer: Self-pay

## 2023-10-10 NOTE — Telephone Encounter (Signed)
 Pt is requesting to transfer care from [facility] to [facility]. PCP is leaving office. Please advise

## 2023-10-10 NOTE — Telephone Encounter (Signed)
 Can you please have this patient come for a follow up of SOB since stopping montelukast  before his next allergy  injection. Thank you

## 2023-10-10 NOTE — Telephone Encounter (Signed)
fine with me.

## 2023-12-03 ENCOUNTER — Ambulatory Visit (INDEPENDENT_AMBULATORY_CARE_PROVIDER_SITE_OTHER): Payer: Self-pay | Admitting: Family

## 2023-12-03 VITALS — BP 138/76 | HR 74 | Temp 98.2°F | Ht 75.0 in | Wt 268.2 lb

## 2023-12-03 DIAGNOSIS — E66811 Obesity, class 1: Secondary | ICD-10-CM

## 2023-12-03 DIAGNOSIS — I83811 Varicose veins of right lower extremities with pain: Secondary | ICD-10-CM | POA: Diagnosis not present

## 2023-12-03 DIAGNOSIS — R0683 Snoring: Secondary | ICD-10-CM

## 2023-12-03 NOTE — Progress Notes (Unsigned)
 New Patient Office Visit  Subjective:  Patient ID: Lance Rodriguez, male    DOB: February 25, 1996  Age: 28 y.o. MRN: [id number]  CC:  Chief Complaint  Patient presents with  . Transitions Of Care    Patient asking about blood work, states he hasn't had a physical in a long time. Wants to discuss veins in back of leg that is messing with his ankles. High heart rate when sleeping. Main reason for blood work is due to history of thyroid issues.    HPI AUTREY HUMAN presents for establishing care today.  Discussed the use of AI scribe software for clinical note transcription with the patient, who gave verbal consent to proceed.  History of Present Illness Lance Rodriguez is a 28 year old male who presents with fatigue, weight gain, and neck tightness.  Fatigue and sleep disturbance - Persistent fatigue for six months, interfering with daily activities - Adequate sleep duration but persistent tiredness - Loud snoring present - Occasional dizziness without nocturia or night disturbances  Unintentional weight gain - Significant weight gain since the beginning of the year - Maintains a 2500-calorie diet - Engages in regular exercise, including running three times a week  Neck tightness - Neck tightness, especially when turning head or raising arms - Symptoms not relieved by allergy  treatment  Palpitations and tachycardia - Episodes of high heart rate during sleep and occasionally during the day  Lower extremity venous changes - Worsening varicose veins on the back of the right leg - Blood pooling and bruising present - No associated pain  Assessment & Plan Suspected Hypothyroidism Symptoms and family history suggest hypothyroidism. Differential includes metabolic or lifestyle factors. - Schedule physical examination with fasting blood work for thyroid function.  Suspected Sleep Apnea Symptoms and family history suggest sleep apnea. Discussed sleep study options,  noting in-office studies are more thorough. - Refer for sleep study.  Varicose Veins Right leg varicose veins with blood pooling and bruising. Family history noted. Discussed compression socks for circulation improvement. - Refer to vein specialist for evaluation and possible ultrasound. - Recommend wearing compression socks during flights or long trips.  Follow-up Requires further evaluation and management. - Schedule follow-up for physical examination and blood work. - Ensure completion of referrals for sleep study and vein specialist.     Subjective:    Outpatient Medications Prior to Visit  Medication Sig Dispense Refill  . albuterol  (VENTOLIN  HFA) 108 (90 Base) MCG/ACT inhaler Inhale 2 puffs into the lungs every 6 (six) hours as needed for wheezing or shortness of breath. 18 g 1  . EPINEPHrine  (EPIPEN  2-PAK) 0.3 mg/0.3 mL IJ SOAJ injection Inject 0.3 mg into the muscle as needed for anaphylaxis. 2 each 2  . ibuprofen (ADVIL) 600 MG tablet Take 600 mg by mouth as needed.    . montelukast  (SINGULAIR ) 10 MG tablet Take 1 tablet (10 mg total) by mouth at bedtime. (Patient not taking: Reported on 12/03/2023) 30 tablet 5  . mupirocin ointment (BACTROBAN) 2 % Apply 1 Application topically 2 (two) times daily. (Patient not taking: Reported on 12/03/2023)    . sodium chloride (OCEAN) 0.65 % nasal spray Place 2 sprays into the nose as needed. (Patient not taking: Reported on 12/03/2023)     No facility-administered medications prior to visit.   Past Medical History:  Diagnosis Date  . ADHD   . Asthma   . Chickenpox   . Mononucleosis    08/11/21   Past Surgical History:  Procedure Laterality Date  . APPENDECTOMY    . SEPTOPLASTY N/A 04/27/2022  . TYMPANOSTOMY TUBE PLACEMENT      Objective:   Today's Vitals: There were no vitals taken for this visit.  Physical Exam Vitals and nursing note reviewed.  Constitutional:      General: He is not in acute distress.    Appearance: Normal  appearance.  HENT:     Head: Normocephalic.  Cardiovascular:     Rate and Rhythm: Normal rate and regular rhythm.  Pulmonary:     Effort: Pulmonary effort is normal.     Breath sounds: Normal breath sounds.  Musculoskeletal:        General: Normal range of motion.     Cervical back: Normal range of motion.  Skin:    General: Skin is warm and dry.  Neurological:     Mental Status: He is alert and oriented to person, place, and time.  Psychiatric:        Mood and Affect: Mood normal.     Lucius Krabbe, NP

## 2023-12-03 NOTE — Patient Instructions (Addendum)
 Welcome to [facility], It was a pleasure meeting you today!    As discussed, I have sent a referral to the Vein and Vascular office and to Pulmonary for a sleep study.   Please schedule a physical with fasting labs when convenient today.    PLEASE NOTE: If you had any LAB tests please let us  know if you have not heard back within a few days. You may see your results on MyChart before we have a chance to review them but we will give you a call once they are reviewed by us . If we ordered any REFERRALS today, please let us  know if you have not heard from their office within the next week.  Let us  know through MyChart if you are needing REFILLS, or have your pharmacy send us  the request. You can also use MyChart to communicate with me or any office staff.  Please try these tips to maintain a healthy lifestyle: It is important that you exercise regularly at least 30 minutes 5 times a week. Think about what you will eat, plan ahead. Choose whole foods, & think  clean, green, fresh or frozen over canned, processed or packaged foods which are more sugary, salty, and fatty. 70 to 75% of food eaten should be fresh vegetables and protein. 2-3  meals daily with healthy snacks between meals, but must be whole fruit, protein or vegetables. Aim to eat over a 10 hour period when you are active, for example, 7am to 5pm, and then STOP after your last meal of the day, drinking only water.  Shorter eating windows, 6-8 hours, are showing benefits in heart disease and blood sugar regulation. Drink water every day! Shoot for 64 ounces daily = 8 cups, no other drink is as healthy! Fruit juice is best enjoyed in a healthy way, by EATING the fruit.

## 2023-12-05 NOTE — Progress Notes (Signed)
 12/06/23- 28 yoM never smoker for sleep evaluation courtesy of Corean Comment, NP with concern of loud snoiring Epworth score-13 Body weight today- -----Fatigue during the day.  Falls asleep at random times during the day.  Talks during sleep.  Snoring.  Apple watch notifying patient that heart rate is over 110 bpm during sleep. Discussed the use of AI scribe software for clinical note transcription with the patient, who gave verbal consent to proceed.  History of Present Illness   Lance Rodriguez is a 28 year old male who presents with excessive daytime sleepiness and snoring. He was referred by his new primary care physician for evaluation of his sleep issues.  Excessive daytime sleepiness has been present for two years, worsening over the last six to eight months. This coincides with increased snoring, sleep talking, and weight gain. Episodes of gasping for air during snoring have been observed by his wife. He experiences high heart rate notifications from his watch.  He has a history of sleep talking since high school and sleepwalking from childhood through college. He denies recent sleep paralysis, although it was frequent during college. He does not take medications to aid sleep and consumes caffeine moderately.  He underwent a septoplasty with turbinate reduction in the past. He feels sleepy during conversations and has experienced near sleep episodes while driving. He does not feel rested upon waking, with this change noted since last summer.  Family history includes his father using a night guard and his grandfather using a CPAP machine.  Some sleep paralysis. No cataplexy.    Prior to Admission medications   Medication Sig Start Date End Date Taking? Authorizing Provider  albuterol  (VENTOLIN  HFA) 108 (90 Base) MCG/ACT inhaler Inhale 2 puffs into the lungs every 6 (six) hours as needed for wheezing or shortness of breath. 08/06/23  Yes Ambs, Arlean HERO, FNP  cetirizine (ZYRTEC)  10 MG tablet Take 10 mg by mouth daily.   Yes [provider]  EPINEPHrine  (EPIPEN  2-PAK) 0.3 mg/0.3 mL IJ SOAJ injection Inject 0.3 mg into the muscle as needed for anaphylaxis. 07/31/23  Yes Marinda Rocky SAILOR, MD  ibuprofen (ADVIL) 600 MG tablet Take 600 mg by mouth as needed. 04/27/22  Yes [provider]     Past Medical History:  Diagnosis Date   ADHD    Asthma    Chickenpox    Mononucleosis    08/11/21   Past Surgical History:  Procedure Laterality Date   APPENDECTOMY     SEPTOPLASTY N/A 04/27/2022   TYMPANOSTOMY TUBE PLACEMENT     Family History  Problem Relation Age of Onset   Allergic rhinitis Mother    Allergic rhinitis Father    Hyperlipidemia Father    Heart disease Father    Hypertension Father    Eczema Brother    Hyperlipidemia Paternal Grandfather    Heart disease Paternal Grandfather    Hypertension Paternal Grandfather    Arthritis Other    Social History   Socioeconomic History   Marital status: Married    Spouse name: Not on file   Number of children: Not on file   Years of education: Not on file   Highest education level: Bachelor's degree (e.g., BA, AB, BS)  Occupational History   Not on file  Tobacco Use   Smoking status: Never    Passive exposure: Past   Smokeless tobacco: Never   Tobacco comments:    Did smoke occasionally for 2 years in college.  Vaping Use  Vaping status: Never Used  Substance and Sexual Activity   Alcohol use: Yes    Comment: socailly   Drug use: No   Sexual activity: Not Currently  Other Topics Concern   Not on file  Social History Narrative   Works Airline pilot in [employer]    Social Drivers of Health   Financial Resource Strain: Low Risk  (12/03/2023)   Overall Financial Resource Strain (CARDIA)    Difficulty of Paying Living Expenses: Not very hard  Food Insecurity: No Food Insecurity (12/03/2023)   Hunger Vital Sign    Worried About Running Out of Food in the Last Year: Never true    Ran Out of  Food in the Last Year: Never true  Transportation Needs: No Transportation Needs (12/03/2023)   PRAPARE - Administrator, Civil Service (Medical): No    Lack of Transportation (Non-Medical): No  Physical Activity: Sufficiently Active (12/03/2023)   Exercise Vital Sign    Days of Exercise per Week: 5 days    Minutes of Exercise per Session: 60 min  Stress: Stress Concern Present (12/03/2023)   [employer] of Occupational Health - Occupational Stress Questionnaire    Feeling of Stress: To some extent  Social Connections: Socially Integrated (12/03/2023)   Social Connection and Isolation Panel    Frequency of Communication with Friends and Family: More than three times a week    Frequency of Social Gatherings with Friends and Family: Three times a week    Attends Religious Services: More than 4 times per year    Active Member of Clubs or Organizations: Yes    Attends Banker Meetings: More than 4 times per year    Marital Status: Married  Catering manager Violence: Not At Risk (12/03/2023)   Humiliation, Afraid, Rape, and Kick questionnaire    Fear of Current or Ex-Partner: No    Emotionally Abused: No    Physically Abused: No    Sexually Abused: No   Assessment and Plan:    Suspected sleep apnea Suspected sleep apnea due to snoring, gasping, and excessive daytime sleepiness. Family history supports suspicion. Explained pathophysiology and long-term risks, including cardiovascular and cognitive issues. - Order home sleep study to confirm diagnosis. - Discuss potential treatment options if diagnosed, including CPAP and oral appliances.  Excessive daytime sleepiness Excessive daytime sleepiness likely due to sleep apnea, given symptoms and family history. - Order home sleep study to evaluate for sleep apnea.       ROS-see HPI   += positive Constitutional:    weight loss, night sweats, fevers, chills, fatigue, lassitude. HEENT:    headaches, difficulty  swallowing, tooth/dental problems, sore throat,       sneezing, itching, ear ache, nasal congestion, post nasal drip, snoring CV:    chest pain, orthopnea, PND, swelling in lower extremities, anasarca,                                   dizziness, palpitations Resp:   shortness of breath with exertion or at rest.                productive cough,   non-productive cough, coughing up of blood.              change in color of mucus.  wheezing.   Skin:    rash or lesions. GI:  No-   heartburn, indigestion, abdominal pain, nausea, vomiting, diarrhea,  change in bowel habits, loss of appetite GU: dysuria, change in color of urine, no urgency or frequency.   flank pain. MS:   joint pain, stiffness, decreased range of motion, back pain. Neuro-     nothing unusual Psych:  change in mood or affect.  depression or anxiety.   memory loss.  OBJ- Physical Exam General- Alert, Oriented, Affect-appropriate, Distress- none acute, +big/tall Skin- rash-none, lesions- none, excoriation- none Lymphadenopathy- none Head- atraumatic            Eyes- Gross vision intact, PERRLA, conjunctivae and secretions clear            Ears- Hearing, canals-normal            Nose- Clear, no-Septal dev, mucus, polyps, erosion, perforation             Throat- Mallampati II , mucosa clear , drainage- none, tonsils+, teeth+ Neck- flexible , trachea midline, no stridor , thyroid nl, carotid no bruit Chest - symmetrical excursion , unlabored           Heart/CV- RRR , no murmur , no gallop  , no rub, nl s1 s2                           - JVD- none , edema- none, stasis changes- none, varices- none           Lung- clear to P&A, wheeze- none, cough- none , dullness-none, rub- none           Chest wall-  Abd-  Br/ Gen/ Rectal- Not done, not indicated Extrem- cyanosis- none, clubbing, none, atrophy- none, strength- nl Neuro- grossly intact to observation

## 2023-12-06 ENCOUNTER — Encounter: Payer: Self-pay | Admitting: Internal Medicine

## 2023-12-06 ENCOUNTER — Ambulatory Visit (INDEPENDENT_AMBULATORY_CARE_PROVIDER_SITE_OTHER): Admitting: Internal Medicine

## 2023-12-06 VITALS — BP 134/72 | HR 82 | Temp 97.5°F | Ht 75.0 in | Wt 271.8 lb

## 2023-12-06 DIAGNOSIS — G4710 Hypersomnia, unspecified: Secondary | ICD-10-CM

## 2023-12-06 DIAGNOSIS — R0683 Snoring: Secondary | ICD-10-CM

## 2023-12-06 NOTE — Patient Instructions (Signed)
 Order- schedule home sleep test      dx snoring  Please cal us  about 2 weeks after your sleep test for results and recommendation.

## 2023-12-11 ENCOUNTER — Encounter: Payer: Self-pay | Admitting: Family

## 2023-12-12 ENCOUNTER — Ambulatory Visit (INDEPENDENT_AMBULATORY_CARE_PROVIDER_SITE_OTHER): Admitting: Family

## 2023-12-12 ENCOUNTER — Other Ambulatory Visit (HOSPITAL_COMMUNITY)
Admission: RE | Admit: 2023-12-12 | Discharge: 2023-12-12 | Disposition: A | Discharge status: Home / Self Care | Source: Ambulatory Visit | Attending: Family | Admitting: Family

## 2023-12-12 ENCOUNTER — Encounter: Payer: Self-pay | Admitting: Family

## 2023-12-12 VITALS — BP 122/71 | HR 72 | Temp 97.3°F | Ht 75.0 in | Wt 268.5 lb

## 2023-12-12 DIAGNOSIS — Z1283 Encounter for screening for malignant neoplasm of skin: Secondary | ICD-10-CM

## 2023-12-12 DIAGNOSIS — Z0000 Encounter for general adult medical examination without abnormal findings: Secondary | ICD-10-CM

## 2023-12-12 DIAGNOSIS — R030 Elevated blood-pressure reading, without diagnosis of hypertension: Secondary | ICD-10-CM

## 2023-12-12 DIAGNOSIS — Z113 Encounter for screening for infections with a predominantly sexual mode of transmission: Secondary | ICD-10-CM | POA: Insufficient documentation

## 2023-12-12 LAB — CBC WITH DIFFERENTIAL/PLATELET
Basophils Absolute: 0.1 K/uL (ref 0.0–0.1)
Basophils Relative: 0.8 % (ref 0.0–3.0)
Eosinophils Absolute: 0.3 K/uL (ref 0.0–0.7)
Eosinophils Relative: 3.7 % (ref 0.0–5.0)
HCT: 44.4 % (ref 39.0–52.0)
Hemoglobin: 15.5 g/dL (ref 13.0–17.0)
Lymphocytes Relative: 24.6 % (ref 12.0–46.0)
Lymphs Abs: 1.7 K/uL (ref 0.7–4.0)
MCHC: 34.9 g/dL (ref 30.0–36.0)
MCV: 82.8 fl (ref 78.0–100.0)
Monocytes Absolute: 0.5 K/uL (ref 0.1–1.0)
Monocytes Relative: 7.3 % (ref 3.0–12.0)
Neutro Abs: 4.5 K/uL (ref 1.4–7.7)
Neutrophils Relative %: 63.6 % (ref 43.0–77.0)
Platelets: 196 K/uL (ref 150.0–400.0)
RBC: 5.36 Mil/uL (ref 4.22–5.81)
RDW: 12.6 % (ref 11.5–15.5)
WBC: 7.1 K/uL (ref 4.0–10.5)

## 2023-12-12 LAB — LIPID PANEL
Cholesterol: 146 mg/dL (ref 0–200)
HDL: 39.1 mg/dL (ref >39.00)
LDL Cholesterol: 85 mg/dL (ref 0–99)
NonHDL: 107
Total CHOL/HDL Ratio: 4
Triglycerides: 109 mg/dL (ref 0.0–149.0)
VLDL: 21.8 mg/dL (ref 0.0–40.0)

## 2023-12-12 LAB — COMPREHENSIVE METABOLIC PANEL WITH GFR
ALT: 40 U/L (ref 0–53)
AST: 27 U/L (ref 0–37)
Albumin: 4.8 g/dL (ref 3.5–5.2)
Alkaline Phosphatase: 54 U/L (ref 39–117)
BUN: 16 mg/dL (ref 6–23)
CO2: 25 meq/L (ref 19–32)
Calcium: 9.3 mg/dL (ref 8.4–10.5)
Chloride: 104 meq/L (ref 96–112)
Creatinine, Ser: 1.01 mg/dL (ref 0.40–1.50)
GFR: 102.54 mL/min (ref >60.00)
Glucose, Bld: 94 mg/dL (ref 70–99)
Potassium: 4 meq/L (ref 3.5–5.1)
Sodium: 138 meq/L (ref 135–145)
Total Bilirubin: 0.9 mg/dL (ref 0.2–1.2)
Total Protein: 7.5 g/dL (ref 6.0–8.3)

## 2023-12-12 LAB — URINE CYTOLOGY ANCILLARY ONLY
Chlamydia: NEGATIVE
Comment: NEGATIVE
Comment: NEGATIVE
Comment: NORMAL
Neisseria Gonorrhea: NEGATIVE
Trichomonas: NEGATIVE

## 2023-12-12 LAB — TSH: TSH: 2.21 u[IU]/mL (ref 0.35–5.50)

## 2023-12-12 NOTE — Progress Notes (Signed)
 Phone: [phone]  Subjective:  Patient 28 y.o. male presenting for annual physical.  Chief Complaint  Patient presents with   Annual Exam    Fasting w/ labs   Discussed the use of AI scribe software for clinical note transcription with the patient, who gave verbal consent to proceed.  History of Present Illness Lance Rodriguez is a 28 year old male who presents for an annual physical exam.  Pharyngeal discomfort - Throat discomfort for the past two days - Possible etiology includes allergy -related postnasal drainage - Uses Zyrtec and saline mix for symptomatic relief  Blood pressure evaluation - Blood pressure today is 122 mmHg - Previous blood pressure readings in the 130s  Bowel habits and hydration - Regular, soft bowel movements - Maintains adequate hydration  Oral hygiene and dental care - Brushes teeth twice daily - Plans to establish dental care this year  See problem oriented charting- ROS- full  review of systems was completed and negative except for: what is noted in HPI above.  The following were reviewed and entered/updated in epic: Past Medical History:  Diagnosis Date   Acquired nasal deformity 02/01/2022   ADHD    Asthma    Chickenpox    Deviated septum 01/18/2022   Mononucleosis    08/11/21   Nasal turbinate hypertrophy 01/18/2022   Nasal valve collapse 01/18/2022   Seasonal allergic rhinitis 01/18/2022   Patient Active Problem List   Diagnosis Date Noted   Deviated nasal septum 09/14/2021   Allergic conjunctivitis of both eyes 07/27/2021   Seasonal and perennial allergic rhinitis 07/27/2021   Mild persistent asthma without complication 07/27/2021   Encounter for general adult medical examination with abnormal findings 03/15/2021   Left knee pain 03/15/2021   Obesity (BMI 30.0-34.9) 03/15/2021   ADHD 11/20/2016   Asthma 11/20/2016   Dog bite 11/20/2016   Past Surgical History:  Procedure Laterality Date   APPENDECTOMY      SEPTOPLASTY N/A 04/27/2022   TYMPANOSTOMY TUBE PLACEMENT      Family History  Problem Relation Age of Onset   Allergic rhinitis Mother    Allergic rhinitis Father    Hyperlipidemia Father    Heart disease Father    Hypertension Father    Eczema Brother    Hyperlipidemia Paternal Grandfather    Heart disease Paternal Grandfather    Hypertension Paternal Grandfather    Arthritis Other     Medications- reviewed and updated Current Outpatient Medications  Medication Sig Dispense Refill   albuterol  (VENTOLIN  HFA) 108 (90 Base) MCG/ACT inhaler Inhale 2 puffs into the lungs every 6 (six) hours as needed for wheezing or shortness of breath. 18 g 1   cetirizine (ZYRTEC) 10 MG tablet Take 10 mg by mouth daily.     EPINEPHrine  (EPIPEN  2-PAK) 0.3 mg/0.3 mL IJ SOAJ injection Inject 0.3 mg into the muscle as needed for anaphylaxis. 2 each 2   ibuprofen (ADVIL) 600 MG tablet Take 600 mg by mouth as needed.     No current facility-administered medications for this visit.    Allergies-reviewed and updated No Known Allergies  Social History   Social History Narrative   Works Airline pilot in [employer]     Objective:  BP 122/71 (BP Location: Left Arm, Patient Position: Sitting, Cuff Size: Large)   Pulse 72   Temp (!) 97.3 F (36.3 C) (Temporal)   Ht 6' 3 (1.905 m)   Wt 268 lb 8 oz (121.8 kg)   SpO2 100%   BMI 33.56  kg/m  Physical Exam Vitals and nursing note reviewed.  Constitutional:      General: He is not in acute distress.    Appearance: Normal appearance.  HENT:     Head: Normocephalic.     Right Ear: Tympanic membrane and external ear normal.     Left Ear: Tympanic membrane and external ear normal.     Nose: Nose normal.     Mouth/Throat:     Mouth: Mucous membranes are moist.  Eyes:     Extraocular Movements: Extraocular movements intact.  Cardiovascular:     Rate and Rhythm: Normal rate and regular rhythm.  Pulmonary:     Effort: Pulmonary effort is normal.     Breath  sounds: Normal breath sounds.  Abdominal:     General: Abdomen is flat. There is no distension.     Palpations: Abdomen is soft.     Tenderness: There is no abdominal tenderness.  Musculoskeletal:        General: Normal range of motion.     Cervical back: Normal range of motion.  Skin:    General: Skin is warm and dry.  Neurological:     Mental Status: He is alert and oriented to person, place, and time.  Psychiatric:        Mood and Affect: Mood normal.        Behavior: Behavior normal.        Judgment: Judgment normal.      Assessment and Plan   Health Maintenance counseling: 1. Anticipatory guidance: Patient counseled regarding regular dental exams q6 months, eye exams yearly, avoiding smoking and second hand smoke, limiting alcohol to 2 beverages per day.   2. Risk factor reduction:  Advised patient of need for regular exercise and diet rich in fruits and vegetables to reduce risk of heart attack and stroke.    Wt Readings from Last 3 Encounters:  12/12/23 268 lb 8 oz (121.8 kg)  12/06/23 271 lb 12.8 oz (123.3 kg)  12/03/23 268 lb 3.2 oz (121.7 kg)   3. Immunizations/screenings/ancillary studies Immunization History  Administered Date(s) Administered   HPV 9-valent 11/20/2016   Influenza,inj,Quad PF,6+ Mos 04/27/2017   There are no preventive care reminders to display for this patient.  4. Skin cancer screening-  advised regular sunscreen use. Denies worrisome, changing, or new skin lesions.  5. Smoking associated screening:  never smoked 6. STD screening - none needed 7. Alcohol screening: less than 1 drink per week  Assessment & Plan Allergic Rhinitis Swollen left tonsil and throat discomfort likely due to allergy  drainage. Symptoms include throat discomfort and congestion. Currently using Zyrtec and saline rinse. Preference for generic Nasacort for nasal congestion and allergy  symptoms. - Recommend over-the-counter generic Nasacort for nasal congestion and all  allergy  symptoms. - Call office if symptoms persist.  General Health Maintenance Discussed importance of skin cancer screening due to fair skin and increased melanoma risk. Emphasized dental care and establishing with a dentist. Discussed monitoring blood pressure and lifestyle modifications to prevent hypertension. Discussed how weight gain and eating too many carbs or sweets can lead to T2DM especially with family history of prediabetes. - Refer to dermatologist for skin cancer screening. - Order routine lab tests including blood count, cholesterol, metabolic panel, and thyroid function. - Perform urine test for STDs including chlamydia, gonorrhea, and trichomonas. - Encourage establishment with a dentist for routine dental care. - Monitor blood pressure and encourage lifestyle modifications such as weight management and regular exercise. - Check fasting  blood glucose and consider A1c if glucose is elevated.  Follow-up Follow-up for lab results and any further necessary evaluations. - Review lab results by early next week and follow up if any abnormalities are noted.   Recommended follow up: No follow-ups on file. Future Appointments  Date Time Provider Department Center  02/04/2024  9:30 AM Cheryl Reusing, FNP [facility] None  02/05/2024  3:15 PM Neysa Rama D, MD [facility] None  03/04/2024 12:00 PM [facility]  03/04/2024  1:20 PM Magda Debby SAILOR, MD [facility]    Lab/Order associations:  fasting  Lucius Krabbe, NP

## 2023-12-12 NOTE — Patient Instructions (Addendum)
 It was very nice to see you today!   I will review your lab results via MyChart in a few days. See the handout regarding high blood pressure. Increase your cardio exercise to at least 20-30 minutes as many days as possible!  Start working on what we discussed today regarding your weight loss goals:  1) Eat small portions, ok to eat 6 mini meals vs. 3 big meals if this controls your hunger  better. 2) Eat until full and then STOP! This is your body telling you it has had enough. 3) Drink at least 64 oz water = 2 liters = 8, 8oz cups DAILY. 4) Eat most of your calories earlier in the day (if you work during the day).  Want to eat within a 8-10hour window if possible. No eating after 7pm, only no calorie drinks or water from 7p until bedtime. 5) Look for low carb, high protein foods/recipes. Avoid simple carbs including sweets, white bread, rice, potatoes, pasta. Look for whole grain/whole wheat/or almond flour if eating these foods. 6) High protein foods: meats (limit red meat), including [place], chicken, pork, FISH (best source) nuts, cheese, beans (black beans, soy/edamame beans are best). Protein drinks, bars are also good but must have low sugar, look for < 10 grams. 7) Avoid processed/refined sugar and artificial sweeteners if possible. Stevia, Erythritol, or Monk fruit sweetener are preferred, natural, no calorie sweeteners.  Natural sweeteners like Agave or honey in very small amounts are ok also.       PLEASE NOTE:  If you had any lab tests please let us  know if you have not heard back within a few days. You may see your results on MyChart before we have a chance to review them but we will give you a call once they are reviewed by us . If we ordered any referrals today, please let us  know if you have not heard from their office within the next week.

## 2023-12-13 ENCOUNTER — Ambulatory Visit: Payer: Self-pay | Admitting: Family

## 2023-12-18 ENCOUNTER — Encounter

## 2023-12-18 DIAGNOSIS — R0683 Snoring: Secondary | ICD-10-CM

## 2023-12-18 DIAGNOSIS — G4730 Sleep apnea, unspecified: Secondary | ICD-10-CM | POA: Diagnosis not present

## 2023-12-30 DIAGNOSIS — G4733 Obstructive sleep apnea (adult) (pediatric): Secondary | ICD-10-CM | POA: Diagnosis not present

## 2024-01-09 ENCOUNTER — Telehealth: Payer: Self-pay

## 2024-01-09 NOTE — Telephone Encounter (Signed)
 Copied from CRM [id number]. Topic: Clinical - Lab/Test Results >> Jan 09, 2024 10:14 AM Benton KIDD wrote: Reason for CRM: patient is calling because of his sleep test results and needing a better explanation of the results . Did tell patient he has a follow up appointment with dr young on 9/9  please give patient a call to discuss sleep results .  [phone]  Dr. Neysa can you please advise of sleep study results.

## 2024-01-14 NOTE — Telephone Encounter (Signed)
 His home sleep test showed mild obstructive sleep apnea, averaging 10 events per hour. Since he is bothered by sleepiness, he would qualify for either CPAP or a fitted oral appliance. Unless he has a preference, I usually suggest people try CPAP first.  If he agrees- Please order new DME, new CPAP, auto 5-20, mask of choice, humidifier, supplies, AirView/ card. If he wants more discussion first, then we will talk at his upcoming appointment.

## 2024-01-15 NOTE — Telephone Encounter (Signed)
 Spoke with the patient.  Advised of sleep study results.  Pt verbalized understanding &  would like to wait until 9/9 to discuss in further detail with Dr. Neysa information about CPAP and oral appliance.

## 2024-02-01 ENCOUNTER — Other Ambulatory Visit: Payer: Self-pay | Admitting: Vascular Surgery

## 2024-02-01 DIAGNOSIS — M7989 Other specified soft tissue disorders: Secondary | ICD-10-CM

## 2024-02-04 ENCOUNTER — Ambulatory Visit: Admitting: Family

## 2024-02-04 NOTE — Progress Notes (Unsigned)
 12/06/23- 28 yoM never smoker for sleep evaluation courtesy of Corean Comment, NP with concern of loud snoiring Epworth score-13 Body weight today- -----Fatigue during the day.  Falls asleep at random times during the day.  Talks during sleep.  Snoring.  Apple watch notifying patient that heart rate is over 110 bpm during sleep. Discussed the use of AI scribe software for clinical note transcription with the patient, who gave verbal consent to proceed.  History of Present Illness   Lance Rodriguez is a 28 year old male who presents with excessive daytime sleepiness and snoring. He was referred by his new primary care physician for evaluation of his sleep issues.  Excessive daytime sleepiness has been present for two years, worsening over the last six to eight months. This coincides with increased snoring, sleep talking, and weight gain. Episodes of gasping for air during snoring have been observed by his wife. He experiences high heart rate notifications from his watch.  He has a history of sleep talking since high school and sleepwalking from childhood through college. He denies recent sleep paralysis, although it was frequent during college. He does not take medications to aid sleep and consumes caffeine moderately.  He underwent a septoplasty with turbinate reduction in the past. He feels sleepy during conversations and has experienced near sleep episodes while driving. He does not feel rested upon waking, with this change noted since last summer.  Family history includes his father using a night guard and his grandfather using a CPAP machine.  Some sleep paralysis. No cataplexy.    Assessment and Plan:    Suspected sleep apnea Suspected sleep apnea due to snoring, gasping, and excessive daytime sleepiness. Family history supports suspicion. Explained pathophysiology and long-term risks, including cardiovascular and cognitive issues. - Order home sleep study to confirm  diagnosis. - Discuss potential treatment options if diagnosed, including CPAP and oral appliances.  Excessive daytime sleepiness Excessive daytime sleepiness likely due to sleep apnea, given symptoms and family history. - Order home sleep study to evaluate for sleep apnea.     02/05/24- 28 yoM never smoker for sleep evaluation courtesy of Corean Comment, NP with concern of loud snoiring HST(SNAP)-12/18/23- AHI (3%) 10.1/hr, desat to 86%, body weight 272 lbs For treatment decision Body weight today-   ROS-see HPI   += positive Constitutional:    weight loss, night sweats, fevers, chills, fatigue, lassitude. HEENT:    headaches, difficulty swallowing, tooth/dental problems, sore throat,       sneezing, itching, ear ache, nasal congestion, post nasal drip, snoring CV:    chest pain, orthopnea, PND, swelling in lower extremities, anasarca,                                   dizziness, palpitations Resp:   shortness of breath with exertion or at rest.                productive cough,   non-productive cough, coughing up of blood.              change in color of mucus.  wheezing.   Skin:    rash or lesions. GI:  No-   heartburn, indigestion, abdominal pain, nausea, vomiting, diarrhea,                 change in bowel habits, loss of appetite GU: dysuria, change in color of urine, no urgency or frequency.  flank pain. MS:   joint pain, stiffness, decreased range of motion, back pain. Neuro-     nothing unusual Psych:  change in mood or affect.  depression or anxiety.   memory loss.  OBJ- Physical Exam General- Alert, Oriented, Affect-appropriate, Distress- none acute, +big/tall Skin- rash-none, lesions- none, excoriation- none Lymphadenopathy- none Head- atraumatic            Eyes- Gross vision intact, PERRLA, conjunctivae and secretions clear            Ears- Hearing, canals-normal            Nose- Clear, no-Septal dev, mucus, polyps, erosion, perforation             Throat- Mallampati  II , mucosa clear , drainage- none, tonsils+, teeth+ Neck- flexible , trachea midline, no stridor , thyroid nl, carotid no bruit Chest - symmetrical excursion , unlabored           Heart/CV- RRR , no murmur , no gallop  , no rub, nl s1 s2                           - JVD- none , edema- none, stasis changes- none, varices- none           Lung- clear to P&A, wheeze- none, cough- none , dullness-none, rub- none           Chest wall-  Abd-  Br/ Gen/ Rectal- Not done, not indicated Extrem- cyanosis- none, clubbing, none, atrophy- none, strength- nl Neuro- grossly intact to observation

## 2024-02-05 ENCOUNTER — Encounter: Payer: Self-pay | Admitting: Internal Medicine

## 2024-02-05 ENCOUNTER — Ambulatory Visit (INDEPENDENT_AMBULATORY_CARE_PROVIDER_SITE_OTHER): Admitting: Internal Medicine

## 2024-02-05 VITALS — BP 126/72 | HR 80 | Temp 98.1°F | Ht 75.0 in | Wt 267.0 lb

## 2024-02-05 DIAGNOSIS — G4733 Obstructive sleep apnea (adult) (pediatric): Secondary | ICD-10-CM

## 2024-02-05 NOTE — Patient Instructions (Signed)
 Order- refer to Dr Oneil Forget, Orthodontist     consider oral appliance for OSA  Please let us  know if we can help

## 2024-03-04 ENCOUNTER — Ambulatory Visit: Attending: Vascular Surgery | Admitting: Vascular Surgery

## 2024-03-04 ENCOUNTER — Encounter: Payer: Self-pay | Admitting: Vascular Surgery

## 2024-03-04 ENCOUNTER — Ambulatory Visit (HOSPITAL_COMMUNITY)
Admission: RE | Admit: 2024-03-04 | Discharge: 2024-03-04 | Disposition: A | Discharge status: Home / Self Care | Source: Ambulatory Visit | Attending: Vascular Surgery | Admitting: Vascular Surgery

## 2024-03-04 VITALS — BP 131/79 | HR 73 | Temp 98.0°F | Ht 75.0 in | Wt 266.0 lb

## 2024-03-04 DIAGNOSIS — M7989 Other specified soft tissue disorders: Secondary | ICD-10-CM | POA: Insufficient documentation

## 2024-03-04 DIAGNOSIS — I872 Venous insufficiency (chronic) (peripheral): Secondary | ICD-10-CM

## 2024-03-04 NOTE — Progress Notes (Unsigned)
 [facility] [place]  ASSESSMENT / PLAN: 28 y.o. male with chronic venous insufficiency of the right lower extremity with varicosities, swelling, and hemosiderin deposition around the pretibial skin.  Patient has significant superficial venous reflux as well as deep venous reflux on duplex.  I do think his disease is amenable to endovenous ablation and stab phlebectomy.  I counseled patient that intervention should be considered if he is highly symptomatic.  He prefers conservative measures at this time.  I think this is reasonable.  He should follow-up with us  as needed.  CHIEF COMPLAINT: Veins right calf  HISTORY OF PRESENT ILLNESS: Lance Rodriguez is a 28 y.o. male referred to clinic for evaluation of prominent varicose veins of the right calf.  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  He has noted prominent varicose veins since his early adult life.  These are minimally symptomatic to him.  He does notice some swelling in the foot and ankle typical of venous insufficiency.  He suspects that because this was his sliding leg, he did some damage to it over a long career in baseball.  His venous symptoms are quite minimal outside of the prominent varicosities.  We reviewed the options including watchful waiting and intervention therapy, both of which are appropriate for him.  He elected to monitor this for now.  Past Medical History:  Diagnosis Date   Acquired nasal deformity 02/01/2022   ADHD    Asthma    Chickenpox    Deviated septum 01/18/2022   Mononucleosis    08/11/21   Nasal turbinate hypertrophy 01/18/2022   Nasal valve collapse 01/18/2022   Seasonal allergic rhinitis 01/18/2022    Past Surgical History:  Procedure Laterality Date   APPENDECTOMY     SEPTOPLASTY N/A 04/27/2022   TYMPANOSTOMY TUBE PLACEMENT      Family History  Problem Relation Age of Onset   Allergic rhinitis Mother    Allergic rhinitis Father    Hyperlipidemia  Father    Heart disease Father    Hypertension Father    Eczema Brother    Hyperlipidemia Paternal Grandfather    Heart disease Paternal Grandfather    Hypertension Paternal Grandfather    Arthritis Other     Social History   Socioeconomic History   Marital status: Married    Spouse name: Not on file   Number of children: Not on file   Years of education: Not on file   Highest education level: Bachelor's degree (e.g., BA, AB, BS)  Occupational History   Not on file  Tobacco Use   Smoking status: Never    Passive exposure: Past   Smokeless tobacco: Never   Tobacco comments:    Did smoke occasionally for 2 years in college.  Vaping Use   Vaping status: Never Used  Substance and Sexual Activity   Alcohol use: Yes    Comment: socailly   Drug use: No   Sexual activity: Not Currently  Other Topics Concern   Not on file  Social History Narrative   Works Airline pilot in [employer]    Social Drivers of Health   Financial Resource Strain: Low Risk  (12/03/2023)   Overall Financial Resource Strain (CARDIA)    Difficulty of Paying Living Expenses: Not very hard  Food Insecurity: No Food Insecurity (12/03/2023)   Hunger Vital Sign    Worried About Running Out of Food in the Last Year: Never true    Ran Out of Food in the  Last Year: Never true  Transportation Needs: No Transportation Needs (12/03/2023)   PRAPARE - Administrator, Civil Service (Medical): No    Lack of Transportation (Non-Medical): No  Physical Activity: Sufficiently Active (12/03/2023)   Exercise Vital Sign    Days of Exercise per Week: 5 days    Minutes of Exercise per Session: 60 min  Stress: Stress Concern Present (12/03/2023)   [employer] of Occupational Health - Occupational Stress Questionnaire    Feeling of Stress: To some extent  Social Connections: Socially Integrated (12/03/2023)   Social Connection and Isolation Panel    Frequency of Communication with Friends and Family: More than three times a  week    Frequency of Social Gatherings with Friends and Family: Three times a week    Attends Religious Services: More than 4 times per year    Active Member of Clubs or Organizations: Yes    Attends Banker Meetings: More than 4 times per year    Marital Status: Married  Catering manager Violence: Not At Risk (12/03/2023)   Humiliation, Afraid, Rape, and Kick questionnaire    Fear of Current or Ex-Partner: No    Emotionally Abused: No    Physically Abused: No    Sexually Abused: No    No Known Allergies  Current Outpatient Medications  Medication Sig Dispense Refill   albuterol  (VENTOLIN  HFA) 108 (90 Base) MCG/ACT inhaler Inhale 2 puffs into the lungs every 6 (six) hours as needed for wheezing or shortness of breath. 18 g 1   cetirizine (ZYRTEC) 10 MG tablet Take 10 mg by mouth daily.     EPINEPHrine  (EPIPEN  2-PAK) 0.3 mg/0.3 mL IJ SOAJ injection Inject 0.3 mg into the muscle as needed for anaphylaxis. 2 each 2   ibuprofen (ADVIL) 600 MG tablet Take 600 mg by mouth as needed.     No current facility-administered medications for this visit.    PHYSICAL EXAM Vitals:   03/04/24 1317  BP: 131/79  Pulse: 73  Temp: 98 F (36.7 C)  SpO2: 98%  Weight: 266 lb (120.7 kg)  Height: 6' 3 (1.905 m)   No acute distress Regular rate and rhythm Unlabored breathing Palpable dorsalis pedis pulses Multiple prominent varicosities of the right calf  PERTINENT LABORATORY AND RADIOLOGIC DATA  Most recent CBC    Latest Ref Rng & Units 12/12/2023    8:42 AM  CBC  WBC 4.0 - 10.5 K/uL 7.1   Hemoglobin 13.0 - 17.0 g/dL 84.4   Hematocrit 60.9 - 52.0 % 44.4   Platelets 150.0 - 400.0 K/uL 196.0      Most recent CMP    Latest Ref Rng & Units 12/12/2023    8:42 AM 03/15/2021    8:40 AM  CMP  Glucose 70 - 99 mg/dL 94  89   BUN 6 - 23 mg/dL 16  15   Creatinine 9.59 - 1.50 mg/dL 8.98  8.81   Sodium 864 - 145 mEq/L 138  138   Potassium 3.5 - 5.1 mEq/L 4.0  4.0   Chloride 96 -  112 mEq/L 104  102   CO2 19 - 32 mEq/L 25  28   Calcium 8.4 - 10.5 mg/dL 9.3  9.7   Total Protein 6.0 - 8.3 g/dL 7.5  7.2   Total Bilirubin 0.2 - 1.2 mg/dL 0.9  0.9   Alkaline Phos 39 - 117 U/L 54  50   AST 0 - 37 U/L 27  22  ALT 0 - 53 U/L 40  33     Renal function CrCl cannot be calculated (Patient's most recent lab result is older than the maximum 21 days allowed.).  Hgb A1c MFr Bld (%)  Date Value  03/15/2021 4.7    LDL Cholesterol  Date Value Ref Range Status  12/12/2023 85 0 - 99 mg/dL Final    Debby SAILOR. Magda, MD FACS [facility] Phone Number: [phone] 03/04/2024 4:12 PM   Total time spent on preparing this encounter including chart review, data review, collecting history, examining the patient, and coordinating care: 45 minutes  Portions of this report may have been transcribed using voice recognition software.  Every effort has been made to ensure accuracy; however, inadvertent computerized transcription errors may still be present.

## 2024-03-27 ENCOUNTER — Ambulatory Visit: Admitting: Dermatology

## 2024-03-27 ENCOUNTER — Encounter: Payer: Self-pay | Admitting: Dermatology

## 2024-03-27 VITALS — BP 102/82 | HR 84

## 2024-03-27 DIAGNOSIS — L578 Other skin changes due to chronic exposure to nonionizing radiation: Secondary | ICD-10-CM

## 2024-03-27 DIAGNOSIS — D2262 Melanocytic nevi of left upper limb, including shoulder: Secondary | ICD-10-CM | POA: Diagnosis not present

## 2024-03-27 DIAGNOSIS — D485 Neoplasm of uncertain behavior of skin: Secondary | ICD-10-CM

## 2024-03-27 DIAGNOSIS — D229 Melanocytic nevi, unspecified: Secondary | ICD-10-CM

## 2024-03-27 DIAGNOSIS — Z1283 Encounter for screening for malignant neoplasm of skin: Secondary | ICD-10-CM | POA: Diagnosis not present

## 2024-03-27 DIAGNOSIS — L821 Other seborrheic keratosis: Secondary | ICD-10-CM

## 2024-03-27 DIAGNOSIS — W908XXA Exposure to other nonionizing radiation, initial encounter: Secondary | ICD-10-CM | POA: Diagnosis not present

## 2024-03-27 DIAGNOSIS — D1801 Hemangioma of skin and subcutaneous tissue: Secondary | ICD-10-CM

## 2024-03-27 DIAGNOSIS — L814 Other melanin hyperpigmentation: Secondary | ICD-10-CM

## 2024-03-27 DIAGNOSIS — L905 Scar conditions and fibrosis of skin: Secondary | ICD-10-CM | POA: Diagnosis not present

## 2024-03-27 NOTE — Patient Instructions (Addendum)

## 2024-03-27 NOTE — Progress Notes (Unsigned)
 New Patient Visit  Discussed the use of AI scribe software for clinical note transcription with the patient, who gave verbal consent to proceed.  History of Present Illness Lance Rodriguez is a 28 year old male who presents for a full body skin check.  He has concerns about two spots on his legs and one under his lung, which have been present for some time. He has previously had spots removed from the middle of his back and the inside of his thigh, which were not believed to be serious.  He notes that he does not get as much sun exposure currently, but tends not to wear a shirt in the summer and wears shorts during the summer.    Subjective  Lance Rodriguez is a 28 y.o. male who presents for the following: Skin Cancer Screening and Full Body Skin Exam  The patient presents for Total-Body Skin Exam (TBSE) for skin cancer screening and mole check. The patient has spots, moles and lesions to be evaluated, some may be new or changing.  Pt has no hx of skin cancer nor family hx.  The following portions of the chart were reviewed this encounter and updated as appropriate: medications, allergies, medical history  Review of Systems:  No other skin or systemic complaints except as noted in HPI or Assessment and Plan.  Objective  Well appearing patient in no apparent distress; mood and affect are within normal limits.  A full examination was performed including scalp, head, eyes, ears, nose, lips, neck, chest, axillae, abdomen, back, buttocks, bilateral upper extremities, bilateral lower extremities, hands, feet, fingers, toes, fingernails, and toenails. All findings within normal limits unless otherwise noted below.   Relevant physical exam findings are noted in the Assessment and Plan.  left shoulder 1.0 cm pink papule with central browness   Assessment & Plan   SKIN CANCER SCREENING PERFORMED TODAY.  ACTINIC DAMAGE - Chronic condition, secondary to cumulative UV/sun  exposure - diffuse scaly erythematous macules with underlying dyspigmentation - Recommend daily broad spectrum sunscreen SPF 30+ to sun-exposed areas, reapply every 2 hours as needed.  - Staying in the shade or wearing long sleeves, sun glasses (UVA+UVB protection) and wide brim hats (4-inch brim around the entire circumference of the hat) are also recommended for sun protection.  - Call for new or changing lesions.  MELANOCYTIC NEVI - Tan-brown and/or pink-flesh-colored symmetric macules and papules - Benign appearing on exam today - Observation - Call clinic for new or changing moles - Recommend daily use of broad spectrum spf 30+ sunscreen to sun-exposed areas.   LENTIGINES Exam: scattered tan macules Due to sun exposure Treatment Plan: Benign-appearing, observe. Recommend daily broad spectrum sunscreen SPF 30+ to sun-exposed areas, reapply every 2 hours as needed.  Call for any changes   HEMANGIOMA Exam: red papule(s) Discussed benign nature. Recommend observation. Call for changes.   SEBORRHEIC KERATOSIS - Stuck-on, waxy, tan-brown papules and/or plaques  - Benign-appearing - Discussed benign etiology and prognosis. - Observe - Call for any changes NEOPLASM OF UNCERTAIN BEHAVIOR OF SKIN left shoulder Skin / nail biopsy Type of biopsy: tangential   Informed consent: discussed and consent obtained   Timeout: patient name, date of birth, surgical site, and procedure verified   Procedure prep:  Patient was prepped and draped in usual sterile fashion Prep type:  Chlorhexidine Anesthesia: the lesion was anesthetized in a standard fashion   Anesthetic:  1% lidocaine w/ epinephrine  1-100,000 buffered w/ 8.4% NaHCO3 Instrument used: DermaBlade  Hemostasis achieved with: aluminum chloride   Outcome: patient tolerated procedure well   Post-procedure details: sterile dressing applied and wound care instructions given   Dressing type: bandage and pressure dressing    Specimen 1  - Surgical pathology Differential Diagnosis: R/O DN vs MM  Check Margins: No  Return in about 2 years (around 03/27/2026) for TBSE with brenda.  I, Darice Smock, CMA, am acting as scribe for Lance CHRISTELLA HOLY, MD.   Documentation: I have reviewed the above documentation for accuracy and completeness, and I agree with the above.  Lance CHRISTELLA HOLY, MD

## 2024-03-28 ENCOUNTER — Encounter: Payer: Self-pay | Admitting: Dermatology

## 2024-03-31 ENCOUNTER — Ambulatory Visit: Payer: Self-pay | Admitting: Dermatology

## 2024-03-31 LAB — SURGICAL PATHOLOGY

## 2024-12-12 ENCOUNTER — Encounter: Admitting: Family

## 2026-03-29 ENCOUNTER — Ambulatory Visit: Admitting: Physician Assistant
